# Patient Record
Sex: Male | Born: 1980 | Race: Black or African American | Hispanic: No | Marital: Married | Smoking: Current some day smoker
Health system: Southern US, Community
[De-identification: ages and names within clinical notes are randomized; demographics above are authoritative.]

## PROBLEM LIST (undated history)

## (undated) DIAGNOSIS — Z789 Other specified health status: Secondary | ICD-10-CM

## (undated) HISTORY — PX: NO PAST SURGERIES: SHX2092

---

## 2000-08-05 ENCOUNTER — Emergency Department (HOSPITAL_COMMUNITY): Admission: EM | Admit: 2000-08-05 | Discharge: 2000-08-05 | Payer: Self-pay | Admitting: *Deleted

## 2002-06-27 ENCOUNTER — Emergency Department (HOSPITAL_COMMUNITY): Admission: EM | Admit: 2002-06-27 | Discharge: 2002-06-27 | Payer: Self-pay | Admitting: Emergency Medicine

## 2002-06-27 ENCOUNTER — Encounter: Payer: Self-pay | Admitting: Emergency Medicine

## 2003-06-17 ENCOUNTER — Emergency Department (HOSPITAL_COMMUNITY): Admission: EM | Admit: 2003-06-17 | Discharge: 2003-06-17 | Payer: Self-pay | Admitting: Emergency Medicine

## 2006-08-22 ENCOUNTER — Emergency Department (HOSPITAL_COMMUNITY): Admission: EM | Admit: 2006-08-22 | Discharge: 2006-08-22 | Payer: Self-pay | Admitting: Emergency Medicine

## 2008-09-30 ENCOUNTER — Emergency Department (HOSPITAL_COMMUNITY): Admission: EM | Admit: 2008-09-30 | Discharge: 2008-09-30 | Payer: Self-pay | Admitting: Emergency Medicine

## 2009-05-19 ENCOUNTER — Emergency Department (HOSPITAL_COMMUNITY): Admission: EM | Admit: 2009-05-19 | Discharge: 2009-05-19 | Payer: Self-pay | Admitting: Emergency Medicine

## 2010-09-08 LAB — CBC
HCT: 38.5 % — ABNORMAL LOW (ref 39.0–52.0)
Hemoglobin: 13.3 g/dL (ref 13.0–17.0)
MCHC: 34.5 g/dL (ref 30.0–36.0)
MCV: 89.7 fL (ref 78.0–100.0)
Platelets: 214 10*3/uL (ref 150–400)
RBC: 4.3 MIL/uL (ref 4.22–5.81)
RDW: 13 % (ref 11.5–15.5)
WBC: 5.3 10*3/uL (ref 4.0–10.5)

## 2010-09-08 LAB — DIFFERENTIAL
Basophils Absolute: 0 10*3/uL (ref 0.0–0.1)
Basophils Relative: 1 % (ref 0–1)
Eosinophils Absolute: 0.1 10*3/uL (ref 0.0–0.7)
Eosinophils Relative: 2 % (ref 0–5)
Lymphocytes Relative: 30 % (ref 12–46)
Lymphs Abs: 1.6 10*3/uL (ref 0.7–4.0)
Monocytes Absolute: 0.3 10*3/uL (ref 0.1–1.0)
Monocytes Relative: 5 % (ref 3–12)
Neutro Abs: 3.3 10*3/uL (ref 1.7–7.7)
Neutrophils Relative %: 62 % (ref 43–77)

## 2010-09-08 LAB — POCT I-STAT, CHEM 8
BUN: 11 mg/dL (ref 6–23)
Calcium, Ion: 1.15 mmol/L (ref 1.12–1.32)
Chloride: 107 mEq/L (ref 96–112)
Creatinine, Ser: 1.1 mg/dL (ref 0.4–1.5)
Glucose, Bld: 101 mg/dL — ABNORMAL HIGH (ref 70–99)
HCT: 40 % (ref 39.0–52.0)
Hemoglobin: 13.6 g/dL (ref 13.0–17.0)
Potassium: 3.7 mEq/L (ref 3.5–5.1)
Sodium: 141 mEq/L (ref 135–145)
TCO2: 26 mmol/L (ref 0–100)

## 2011-03-09 ENCOUNTER — Inpatient Hospital Stay (INDEPENDENT_AMBULATORY_CARE_PROVIDER_SITE_OTHER)
Admission: RE | Admit: 2011-03-09 | Discharge: 2011-03-09 | Disposition: A | Discharge status: Home / Self Care | Payer: Self-pay | Source: Ambulatory Visit | Attending: Family Medicine | Admitting: Family Medicine

## 2011-03-09 DIAGNOSIS — R071 Chest pain on breathing: Secondary | ICD-10-CM

## 2011-09-14 ENCOUNTER — Emergency Department (HOSPITAL_COMMUNITY): Payer: BC Managed Care – PPO

## 2011-09-14 ENCOUNTER — Encounter (HOSPITAL_COMMUNITY): Payer: Self-pay | Admitting: Emergency Medicine

## 2011-09-14 ENCOUNTER — Emergency Department (HOSPITAL_COMMUNITY)
Admission: EM | Admit: 2011-09-14 | Discharge: 2011-09-14 | Disposition: A | Discharge status: Home / Self Care | Payer: BC Managed Care – PPO | Attending: Emergency Medicine | Admitting: Emergency Medicine

## 2011-09-14 DIAGNOSIS — R059 Cough, unspecified: Secondary | ICD-10-CM | POA: Insufficient documentation

## 2011-09-14 DIAGNOSIS — R51 Headache: Secondary | ICD-10-CM | POA: Insufficient documentation

## 2011-09-14 DIAGNOSIS — Z79899 Other long term (current) drug therapy: Secondary | ICD-10-CM | POA: Insufficient documentation

## 2011-09-14 DIAGNOSIS — J3489 Other specified disorders of nose and nasal sinuses: Secondary | ICD-10-CM | POA: Insufficient documentation

## 2011-09-14 DIAGNOSIS — J069 Acute upper respiratory infection, unspecified: Secondary | ICD-10-CM

## 2011-09-14 DIAGNOSIS — R5381 Other malaise: Secondary | ICD-10-CM | POA: Insufficient documentation

## 2011-09-14 DIAGNOSIS — R05 Cough: Secondary | ICD-10-CM | POA: Insufficient documentation

## 2011-09-14 DIAGNOSIS — R0789 Other chest pain: Secondary | ICD-10-CM | POA: Insufficient documentation

## 2011-09-14 MED ORDER — LORATADINE 10 MG PO TABS: 10.0000 mg | ORAL_TABLET | Freq: Every day | ORAL | Status: DC

## 2011-09-14 MED ORDER — IBUPROFEN 800 MG PO TABS
800.0000 mg | ORAL_TABLET | Freq: Once | ORAL | Status: AC
  Administered 2011-09-14: 800 mg via ORAL
  Filled 2011-09-14: qty 1

## 2011-09-14 MED ORDER — BENZONATATE 100 MG PO CAPS: 100.0000 mg | ORAL_CAPSULE | Freq: Three times a day (TID) | ORAL | Status: DC

## 2011-09-14 NOTE — ED Notes (Signed)
Rx x 2, pt voiced understanding to f/u with PCP and return with concerning sx

## 2011-09-14 NOTE — ED Provider Notes (Signed)
History     CSN: [id number]  Arrival date & time 09/14/11  1401   First MD Initiated Contact with Patient 09/14/11 1714      Chief Complaint  Patient presents with  . Headache  . Nasal Congestion    (Consider location/radiation/quality/duration/timing/severity/associated sxs/prior treatment) Patient is a 31 y.o. male presenting with URI. The history is provided by the patient. No language interpreter was used.  URI The primary symptoms include fatigue, headaches and cough. Primary symptoms do not include fever, ear pain, sore throat, wheezing, abdominal pain, nausea, vomiting, myalgias or rash. The current episode started 3 to 5 days ago. This is a new problem. The problem has been gradually worsening.  The fatigue began 3 to 5 days ago. The fatigue has been worsening since its onset.  The headache began more than 2 days ago. The headache developed gradually. Headache is a new problem. The headache is present continuously. The headache is not associated with aura, photophobia, eye pain, paresthesias or weakness.  The cough began 3 to 5 days ago. The cough is new. The cough is productive. The sputum is green.  Symptoms associated with the illness include congestion and rhinorrhea. The illness is not associated with chills, facial pain or sinus pressure. The following treatments were addressed: Acetaminophen was not tried. A decongestant was ineffective. Aspirin was not tried. NSAIDs were not tried.    History reviewed. No pertinent past medical history.  History reviewed. No pertinent past surgical history.  History reviewed. No pertinent family history.  History  Substance Use Topics  . Smoking status: Never Smoker   . Smokeless tobacco: Not on file  . Alcohol Use: No      Review of Systems  Constitutional: Positive for fatigue. Negative for fever, chills, activity change and appetite change.  HENT: Positive for congestion and rhinorrhea. Negative for ear pain, sore throat  and sinus pressure.   Eyes: Negative for photophobia and pain.  Respiratory: Positive for cough and chest tightness. Negative for shortness of breath and wheezing.   Cardiovascular: Negative for chest pain and palpitations.  Gastrointestinal: Negative for nausea, vomiting and abdominal pain.  Genitourinary: Negative for dysuria, urgency, frequency and flank pain.  Musculoskeletal: Negative for myalgias, back pain and gait problem.  Skin: Negative for rash and wound.  Neurological: Positive for headaches. Negative for dizziness, weakness, light-headedness, numbness and paresthesias.  All other systems reviewed and are negative.    Allergies  Review of patient's allergies indicates no known allergies.  Home Medications   Current Outpatient Rx  Name Route Sig Dispense Refill  . BENZONATATE 100 MG PO CAPS Oral Take 1 capsule (100 mg total) by mouth every 8 (eight) hours. 21 capsule 0  . LORATADINE 10 MG PO TABS Oral Take 1 tablet (10 mg total) by mouth daily. 30 tablet 0    BP 140/98  Pulse 65  Temp(Src) 98.4 F (36.9 C) (Oral)  Resp 16  SpO2 99%  Physical Exam  Nursing note and vitals reviewed. Constitutional: He is oriented to person, place, and time. He appears well-developed and well-nourished. No distress.  HENT:  Head: Normocephalic and atraumatic.  Mouth/Throat: Oropharynx is clear and moist. No oropharyngeal exudate.  Eyes: Conjunctivae and EOM are normal. Pupils are equal, round, and reactive to light.  Neck: Normal range of motion. Neck supple.  Cardiovascular: Normal rate, regular rhythm, normal heart sounds and intact distal pulses.  Exam reveals no gallop and no friction rub.   No murmur heard. Pulmonary/Chest: Effort normal  and breath sounds normal. No respiratory distress. He exhibits tenderness (L sided chest on palpation).  Abdominal: Soft. Bowel sounds are normal. There is no tenderness.  Musculoskeletal: Normal range of motion. He exhibits no edema and no  tenderness.  Lymphadenopathy:    He has no cervical adenopathy.  Neurological: He is alert and oriented to person, place, and time. No cranial nerve deficit.  Skin: Skin is warm and dry. No rash noted.    ED Course  Procedures (including critical care time)  Labs Reviewed - No data to display Dg Chest 2 View  09/14/2011  *RADIOLOGY REPORT*  Clinical Data: Chest pain for a few weeks.  Cough.  CHEST - 2 VIEW  Comparison: None.  Findings: Clear lungs.  Heart size within normal limits.  IMPRESSION: Clear lungs.  Original Report Authenticated By: Fuller Canada, M.D.     1. URI (upper respiratory infection)       MDM  URI. Chest x-ray unremarkable. Placed on Claritin and Tessalon for symptom control. Provided a work note.        Dayton Bailiff, MD 09/14/11 [phone]

## 2011-09-14 NOTE — Discharge Instructions (Signed)

## 2011-09-14 NOTE — ED Notes (Signed)
Pt reports headache, sinus congestion, chest tightness, and chills onset Saturday. Pt reports daughter with similar symptoms 1 week ago.

## 2011-09-16 ENCOUNTER — Encounter (HOSPITAL_COMMUNITY): Payer: Self-pay | Admitting: *Deleted

## 2011-09-16 ENCOUNTER — Emergency Department (HOSPITAL_COMMUNITY)
Admission: EM | Admit: 2011-09-16 | Discharge: 2011-09-16 | Disposition: A | Discharge status: Home / Self Care | Payer: BC Managed Care – PPO | Attending: Emergency Medicine | Admitting: Emergency Medicine

## 2011-09-16 DIAGNOSIS — J069 Acute upper respiratory infection, unspecified: Secondary | ICD-10-CM | POA: Insufficient documentation

## 2011-09-16 MED ORDER — FEXOFENADINE-PSEUDOEPHED ER 60-120 MG PO TB12: 1.0000 | ORAL_TABLET | Freq: Two times a day (BID) | ORAL | Status: DC

## 2011-09-16 MED ORDER — IBUPROFEN 200 MG PO TABS
400.0000 mg | ORAL_TABLET | Freq: Once | ORAL | Status: AC
  Administered 2011-09-16: 400 mg via ORAL
  Filled 2011-09-16: qty 2

## 2011-09-16 NOTE — ED Notes (Signed)
Pt states he thinks he is having flu symptoms. Pt states he is having n/v/d. Pt states is also having bodyaches and headaches. Pt denies any chest pain or sob

## 2011-09-16 NOTE — ED Provider Notes (Signed)
History    30ym with congestion. Gradual onset about 2d ago. Persistent since. Cough. Non productive. Mild diffuse HA. No numbness, tingling, loss of strength to acute visual changes. No neck pain or stiffness. No SOB. No CP. NO sick contacts. Nausea. Vomiting x1 yesterday. No urinary complaints. No rash. No significant pmhx. Non smoker.   CSN: [id number]  Arrival date & time 09/16/11  1550   First MD Initiated Contact with Patient 09/16/11 2047      Chief Complaint  Patient presents with  . Headache  . Generalized Body Aches    (Consider location/radiation/quality/duration/timing/severity/associated sxs/prior treatment) HPI  History reviewed. No pertinent past medical history.  History reviewed. No pertinent past surgical history.  No family history on file.  History  Substance Use Topics  . Smoking status: Never Smoker   . Smokeless tobacco: Not on file  . Alcohol Use: No      Review of Systems   Review of symptoms negative unless otherwise noted in HPI.   Allergies  Review of patient's allergies indicates no known allergies.  Home Medications   Current Outpatient Rx  Name Route Sig Dispense Refill  . ASPIRIN 325 MG PO TABS Oral Take 325 mg by mouth daily.    Marland Kitchen LORATADINE 10 MG PO TABS Oral Take 1 tablet (10 mg total) by mouth daily. 30 tablet 0    BP 135/82  Pulse 67  Temp(Src) 98.9 F (37.2 C) (Oral)  Resp 16  Wt 180 lb (81.647 kg)  SpO2 99%  Physical Exam  Nursing note and vitals reviewed. Constitutional: He appears well-developed and well-nourished. No distress.       Sitting up in bed. nad.  HENT:  Head: Normocephalic and atraumatic.  Left Ear: External ear normal.  Mouth/Throat: Oropharynx is clear and moist. No oropharyngeal exudate.  Eyes: Conjunctivae are normal. Right eye exhibits no discharge. Left eye exhibits no discharge.  Neck: Normal range of motion. Neck supple.  Cardiovascular: Normal rate, regular rhythm and normal heart  sounds.  Exam reveals no gallop and no friction rub.   No murmur heard. Pulmonary/Chest: Effort normal and breath sounds normal. No respiratory distress.  Abdominal: Soft. He exhibits no distension. There is no tenderness.  Musculoskeletal: He exhibits no edema and no tenderness.       Le symmetric as compared to each other. No calf tenderness. Neg homans.  Lymphadenopathy:    He has no cervical adenopathy.  Neurological: He is alert.  Skin: Skin is warm and dry. He is not diaphoretic.  Psychiatric: He has a normal mood and affect. His behavior is normal. Thought content normal.    ED Course  Procedures (including critical care time)  Labs Reviewed - No data to display No results found.   1. URI (upper respiratory infection)       MDM  30yM with upper respiratory symptoms, likely viral. Patient is afebrile and clinically well appearing. No respiratory distress on exam. Do not feel that imaging or laboratory testing is indicated at this time. Plan symptomatic treatment. Return precautions were discussed. Outpatient followup as needed.        Raeford Razor, MD 09/16/11 2111

## 2011-09-16 NOTE — Discharge Instructions (Signed)
Antibiotic Nonuse  Your caregiver felt that the infection or problem was not one that would be helped with an antibiotic. Infections may be caused by viruses or bacteria. Only a caregiver can tell which one of these is the likely cause of an illness. A cold is the most common cause of infection in both adults and children. A cold is a virus. Antibiotic treatment will have no effect on a viral infection. Viruses can lead to many lost days of work caring for sick children and many missed days of school. Children may catch as many as 10 "colds" or "flus" per year during which they can be tearful, cranky, and uncomfortable. The goal of treating a virus is aimed at keeping the ill person comfortable. Antibiotics are medications used to help the body fight bacterial infections. There are relatively few types of bacteria that cause infections but there are hundreds of viruses. While both viruses and bacteria cause infection they are very different types of germs. A viral infection will typically go away by itself within 7 to 10 days. Bacterial infections may spread or get worse without antibiotic treatment. Examples of bacterial infections are:  Sore throats (like strep throat or tonsillitis).   Infection in the lung (pneumonia).   Ear and skin infections.  Examples of viral infections are:  Colds or flus.   Most coughs and bronchitis.   Sore throats not caused by Strep.   Runny noses.  It is often best not to take an antibiotic when a viral infection is the cause of the problem. Antibiotics can kill off the helpful bacteria that we have inside our body and allow harmful bacteria to start growing. Antibiotics can cause side effects such as allergies, nausea, and diarrhea without helping to improve the symptoms of the viral infection. Additionally, repeated uses of antibiotics can cause bacteria inside of our body to become resistant. That resistance can be passed onto harmful bacterial. The next time  you have an infection it may be harder to treat if antibiotics are used when they are not needed. Not treating with antibiotics allows our own immune system to develop and take care of infections more efficiently. Also, antibiotics will work better for us when they are prescribed for bacterial infections. Treatments for a child that is ill may include:  Give extra fluids throughout the day to stay hydrated.   Get plenty of rest.   Only give your child over-the-counter or prescription medicines for pain, discomfort, or fever as directed by your caregiver.   The use of a cool mist humidifier may help stuffy noses.   Cold medications if suggested by your caregiver.  Your caregiver may decide to start you on an antibiotic if:  The problem you were seen for today continues for a longer length of time than expected.   You develop a secondary bacterial infection.  SEEK MEDICAL CARE IF:  Fever lasts longer than 5 days.   Symptoms continue to get worse after 5 to 7 days or become severe.   Difficulty in breathing develops.   Signs of dehydration develop (poor drinking, rare urinating, dark colored urine).   Changes in behavior or worsening tiredness (listlessness or lethargy).  Document Released: 08/02/2001 Document Revised: 05/13/2011 Document Reviewed: 01/29/2009 Exit[employer] Patient Information 2012 [employer].Upper Respiratory Infection, Adult An upper respiratory infection (URI) is also sometimes known as the common cold. The upper respiratory tract includes the nose, sinuses, throat, trachea, and bronchi. Bronchi are the airways leading to the lungs. Most   people improve within 1 week, but symptoms can last up to 2 weeks. A residual cough may last even longer.  CAUSES Many different viruses can infect the tissues lining the upper respiratory tract. The tissues become irritated and inflamed and often become very moist. Mucus production is also common. A cold is contagious. You can easily  spread the virus to others by oral contact. This includes kissing, sharing a glass, coughing, or sneezing. Touching your mouth or nose and then touching a surface, which is then touched by another person, can also spread the virus. SYMPTOMS  Symptoms typically develop 1 to 3 days after you come in contact with a cold virus. Symptoms vary from person to person. They may include:  Runny nose.   Sneezing.   Nasal congestion.   Sinus irritation.   Sore throat.   Loss of voice (laryngitis).   Cough.   Fatigue.   Muscle aches.   Loss of appetite.   Headache.   Low-grade fever.  DIAGNOSIS  You might diagnose your own cold based on familiar symptoms, since most people get a cold 2 to 3 times a year. Your caregiver can confirm this based on your exam. Most importantly, your caregiver can check that your symptoms are not due to another disease such as strep throat, sinusitis, pneumonia, asthma, or epiglottitis. Blood tests, throat tests, and X-rays are not necessary to diagnose a common cold, but they may sometimes be helpful in excluding other more serious diseases. Your caregiver will decide if any further tests are required. RISKS AND COMPLICATIONS  You may be at risk for a more severe case of the common cold if you smoke cigarettes, have chronic heart disease (such as heart failure) or lung disease (such as asthma), or if you have a weakened immune system. The very young and very old are also at risk for more serious infections. Bacterial sinusitis, middle ear infections, and bacterial pneumonia can complicate the common cold. The common cold can worsen asthma and chronic obstructive pulmonary disease (COPD). Sometimes, these complications can require emergency medical care and may be life-threatening. PREVENTION  The best way to protect against getting a cold is to practice good hygiene. Avoid oral or hand contact with people with cold symptoms. Wash your hands often if contact occurs.  There is no clear evidence that vitamin C, vitamin E, echinacea, or exercise reduces the chance of developing a cold. However, it is always recommended to get plenty of rest and practice good nutrition. TREATMENT  Treatment is directed at relieving symptoms. There is no cure. Antibiotics are not effective, because the infection is caused by a virus, not by bacteria. Treatment may include:  Increased fluid intake. Sports drinks offer valuable electrolytes, sugars, and fluids.   Breathing heated mist or steam (vaporizer or shower).   Eating chicken soup or other clear broths, and maintaining good nutrition.   Getting plenty of rest.   Using gargles or lozenges for comfort.   Controlling fevers with ibuprofen or acetaminophen as directed by your caregiver.   Increasing usage of your inhaler if you have asthma.  Zinc gel and zinc lozenges, taken in the first 24 hours of the common cold, can shorten the duration and lessen the severity of symptoms. Pain medicines may help with fever, muscle aches, and throat pain. A variety of non-prescription medicines are available to treat congestion and runny nose. Your caregiver can make recommendations and may suggest nasal or lung inhalers for other symptoms.  HOME CARE INSTRUCTIONS     Only take over-the-counter or prescription medicines for pain, discomfort, or fever as directed by your caregiver.   Use a warm mist humidifier or inhale steam from a shower to increase air moisture. This may keep secretions moist and make it easier to breathe.   Drink enough water and fluids to keep your urine clear or pale yellow.   Rest as needed.   Return to work when your temperature has returned to normal or as your caregiver advises. You may need to stay home longer to avoid infecting others. You can also use a face mask and careful hand washing to prevent spread of the virus.  SEEK MEDICAL CARE IF:   After the first few days, you feel you are getting worse  rather than better.   You need your caregiver's advice about medicines to control symptoms.   You develop chills, worsening shortness of breath, or brown or red sputum. These may be signs of pneumonia.   You develop yellow or brown nasal discharge or pain in the face, especially when you bend forward. These may be signs of sinusitis.   You develop a fever, swollen neck glands, pain with swallowing, or white areas in the back of your throat. These may be signs of strep throat.  SEEK IMMEDIATE MEDICAL CARE IF:   You have a fever.   You develop severe or persistent headache, ear pain, sinus pain, or chest pain.   You develop wheezing, a prolonged cough, cough up blood, or have a change in your usual mucus (if you have chronic lung disease).   You develop sore muscles or a stiff neck.  Document Released: 11/17/2000 Document Revised: 05/13/2011 Document Reviewed: 09/25/2010 [facility] Patient Information 2012 [place], [place].  RESOURCE GUIDE  Dental Problems  Patients with Medicaid: [facility] [phone] [address]                                           [phone] [address] Phone:  [phone]                                                  Phone:  [phone]  If unable to pay or uninsured, contact:  [facility] or [facility]. to become qualified for the adult dental clinic.  Chronic Pain Problems Contact [facility]  [phone] Patients need to be referred by their primary care doctor.  Insufficient Money for Medicine Contact [employer] Way:  call "211" or [facility] 850-750-4157.  No Primary Care Doctor Call [facility]  [phone] Other agencies that provide inexpensive medical care    [facility]  [phone]    [facility]  [phone]    [facility]  [phone]    [facility]  [phone]    [facility]  [phone]    [facility]   [phone]  Psychological Services [facility]  [phone] [facility] Services  [phone] [facility] Mental Health   [phone] (emergency services [phone])  Substance Abuse Resources [facility]  [phone] [facility] [phone]  [facility] 217-016-9840 [facility] ([phone] Residential & Outpatient Substance Abuse Program  361-615-1143  Abuse/Neglect [facility] Child Abuse Hotline 3125651952 [facility] Child Abuse Hotline 5041732699 (After Hours)  Emergency Shelter [facility] [phone]  Maternity Homes [facility] [phone] [facility] Services [phone]  MRSA Hotline #:   (276)825-4211    [facility] Resources  [facility]     United [employer]                          [facility] Dept. [address] [place]                       [address]      [address]  [place]ondell Reveal Phone:  [phone]                                   Phone:  [phone]                 Phone:  [phone]  [facility] Mental Health Phone:  [phone]  [facility] Child Abuse Hotline (765)611-8782 [phone] (After Hours)

## 2011-09-16 NOTE — ED Notes (Signed)
Pt presents with c/c of n/v, generalized body aches, chills, and sinus pressure.  Pt also complains of some chest pain related to his cough.  St's he's been coughing up greeish/yellowish phlegm.

## 2012-01-30 ENCOUNTER — Emergency Department (HOSPITAL_COMMUNITY)
Admission: EM | Admit: 2012-01-30 | Discharge: 2012-01-30 | Disposition: A | Discharge status: Home / Self Care | Payer: BC Managed Care – PPO | Attending: Emergency Medicine | Admitting: Emergency Medicine

## 2012-01-30 ENCOUNTER — Encounter (HOSPITAL_COMMUNITY): Payer: Self-pay | Admitting: Emergency Medicine

## 2012-01-30 DIAGNOSIS — L02419 Cutaneous abscess of limb, unspecified: Secondary | ICD-10-CM | POA: Insufficient documentation

## 2012-01-30 DIAGNOSIS — IMO0002 Reserved for concepts with insufficient information to code with codable children: Secondary | ICD-10-CM | POA: Insufficient documentation

## 2012-01-30 DIAGNOSIS — L0291 Cutaneous abscess, unspecified: Secondary | ICD-10-CM

## 2012-01-30 MED ORDER — CLINDAMYCIN HCL 300 MG PO CAPS: 300.0000 mg | ORAL_CAPSULE | Freq: Four times a day (QID) | ORAL | Status: AC

## 2012-01-30 NOTE — ED Notes (Signed)
Pt with multiple small abscess noted to arms and legs x 2 weeks with some purulent drainage

## 2012-01-30 NOTE — ED Provider Notes (Signed)
History     CSN: [id number]  Arrival date & time 01/30/12  0802   First MD Initiated Contact with Patient 01/30/12 0940      Chief Complaint  Patient presents with  . Abscess    (Consider location/radiation/quality/duration/timing/severity/associated sxs/prior treatment) HPI Comments: 31 y/o male presents with multiple abscesses worsening over the past 2 weeks. Went on vacation to the beach 2 weeks right before abscess developed. Began as a little pimple like mass, and two of them got bigger. One on his right arm began draining pus. The large one on his left thigh has not drained anything. Denies any fever, chills, joint pain, or other rashes. No contacts have similar symptoms. Denies being bit by anything.  Patient is a 31 y.o. male presenting with abscess. The history is provided by the patient.  Abscess  Pertinent negatives include no fever.    History reviewed. No pertinent past medical history.  History reviewed. No pertinent past surgical history.  History reviewed. No pertinent family history.  History  Substance Use Topics  . Smoking status: Never Smoker   . Smokeless tobacco: Not on file  . Alcohol Use: No      Review of Systems  Constitutional: Negative for fever and chills.  Musculoskeletal: Negative for arthralgias.  Skin:       Positive for abscess    Allergies  Review of patient's allergies indicates no known allergies.  Home Medications   Current Outpatient Rx  Name Route Sig Dispense Refill  . ASPIRIN 325 MG PO TABS Oral Take 325 mg by mouth every 6 (six) hours as needed. For pain.      BP 126/80  Pulse 90  Temp 98.1 F (36.7 C) (Oral)  Resp 20  SpO2 100%  Physical Exam  Nursing note and vitals reviewed. Constitutional: He is oriented to person, place, and time. He appears well-developed and well-nourished. No distress.  HENT:  Head: Normocephalic and atraumatic.  Eyes: Conjunctivae and EOM are normal.  Neck: Neck supple.    Cardiovascular: Normal rate, regular rhythm and normal heart sounds.   Pulmonary/Chest: Effort normal and breath sounds normal.  Lymphadenopathy:    He has no axillary adenopathy.       Left: No inguinal adenopathy present.  Neurological: He is alert and oriented to person, place, and time.  Skin: Skin is warm and dry.       2cm diameter abscess with purulent drainage present on right upper arm with surrounding erythema. 2 cm diameter abscess without drainage present on left anterior thigh with surrounding erythema. Both tender to palpation and both flucctuant.  Psychiatric: He has a normal mood and affect. His behavior is normal.    ED Course  Procedures (including critical care time) INCISION AND DRAINAGE Performed by: Johnnette Gourd Consent: Verbal consent obtained. Risks and benefits: risks, benefits and alternatives were discussed Type: abscess  Body area: right upper arm, left thigh  Anesthesia: local infiltration  Local anesthetic: lidocaine 2% without epinephrine  Anesthetic total: 5 ml in right arm, 3 ml in left leg  Complexity: complex Blunt dissection to break up loculations  Drainage: purulent  Drainage amount: large  Packing material: 1/4 in iodoform gauze  Patient tolerance: Patient tolerated the procedure well with no immediate complications.    Labs Reviewed - No data to display No results found.   1. Abscess and cellulitis       MDM  31 y/o male with 2 abscesses with surrounding erythema. Purulent drainage expressed. Culture obtained.  Rx clinda. Advised patient to return to ED, urgent care, or one of the locations on resource list in 2-3 days for packing removal.        Trevor Mace, PA-C 01/30/12 1105

## 2012-02-03 NOTE — ED Provider Notes (Signed)
Medical screening examination/treatment/procedure(s) were performed by non-physician practitioner and as supervising physician I was immediately available for consultation/collaboration.  Hurman Horn, MD 02/03/12 1135

## 2012-02-05 ENCOUNTER — Encounter (HOSPITAL_COMMUNITY): Payer: Self-pay | Admitting: *Deleted

## 2012-02-05 ENCOUNTER — Emergency Department (HOSPITAL_COMMUNITY)
Admission: EM | Admit: 2012-02-05 | Discharge: 2012-02-05 | Disposition: A | Discharge status: Home / Self Care | Payer: BC Managed Care – PPO | Attending: Emergency Medicine | Admitting: Emergency Medicine

## 2012-02-05 DIAGNOSIS — Z5189 Encounter for other specified aftercare: Secondary | ICD-10-CM

## 2012-02-05 DIAGNOSIS — Z4801 Encounter for change or removal of surgical wound dressing: Secondary | ICD-10-CM | POA: Insufficient documentation

## 2012-02-05 NOTE — ED Notes (Signed)
Pt is here to have packing removed from right forearm and left thigh that was placed on SUnday or monday

## 2012-02-05 NOTE — ED Provider Notes (Signed)
History  This chart was scribed for Derwood Kaplan, MD by Erskine Emery. This patient was seen in room  and the patient's care was started at 11:26.   CSN: [id number]  Arrival date & time 02/05/12  1107   First MD Initiated Contact with Patient 02/05/12 1126      Chief Complaint  Patient presents with  . Abscess    (Consider location/radiation/quality/duration/timing/severity/associated sxs/prior treatment) The history is provided by the patient. No language interpreter was used.   Dominic Lowery is a 31 y.o. male who presents to the Emergency Department complaining of 3 healing abscesses that were treated here with incision and drainage (that drained puss) last Sunday (6 days ago). Pt was told to come back a week later to have the packing removed. Pt denies any nausea, vomiting, fever, chills, or h/o similar boils and claims he is otherwise healthy. Pt reports he just started his antibiotic course today.  History reviewed. No pertinent past medical history.  History reviewed. No pertinent past surgical history.  No family history on file.  History  Substance Use Topics  . Smoking status: Never Smoker   . Smokeless tobacco: Not on file  . Alcohol Use: No      Review of Systems  Constitutional: Negative for fever and chills.  Respiratory: Negative for shortness of breath.   Gastrointestinal: Negative for nausea and vomiting.  Genitourinary: Negative for difficulty urinating.  Skin:       3 packed and healing abscesses.   Neurological: Negative for weakness.    Allergies  Review of patient's allergies indicates no known allergies.  Home Medications   Current Outpatient Rx  Name Route Sig Dispense Refill  . CLINDAMYCIN HCL 300 MG PO CAPS Oral Take 1 capsule (300 mg total) by mouth 4 (four) times daily. X 7 days 28 capsule 0  . VISINE OP Both Eyes Place 1 drop into both eyes as needed. For dry eyes      BP 118/69  Pulse 77  Temp 98.2 F (36.8 C) (Oral)  Resp  18  SpO2 100%  Physical Exam  Nursing note and vitals reviewed. Constitutional: He is oriented to person, place, and time. He appears well-developed and well-nourished. No distress.  HENT:  Head: Normocephalic and atraumatic.  Eyes: EOM are normal.  Neck: Neck supple. No tracheal deviation present.  Cardiovascular: Normal rate, regular rhythm and normal heart sounds.   Pulmonary/Chest: Effort normal and breath sounds normal. No respiratory distress. He has no wheezes.  Abdominal: Soft. There is no tenderness.  Musculoskeletal: Normal range of motion.  Neurological: He is alert and oriented to person, place, and time.  Skin: Skin is warm and dry.       Right forearm: 4x4cm lesion with surrounding induration that is nontender with no fluctuants. Wound is clean and dry, showing no drainage.  Left forearm: 3 cm area of induration with a lesion in middle, 1 cm in diameter that is clean and dry. Left thigh: 5x3 cm lesion that is clean and dry.  Psychiatric: He has a normal mood and affect. His behavior is normal.    ED Course  Procedures (including critical care time) DIAGNOSTIC STUDIES: Oxygen Saturation is 100% on room air, normal by my interpretation.    COORDINATION OF CARE: 12:10--I evaluated the patient and we discussed a treatment plan including OTC bactracin to which the pt agreed. I instructed the pt to keep the area surrounding all of his abscesses clean and dry, to finish his antibiotic  course as prescribed, and to find a PCP to follow up with.    Labs Reviewed - No data to display No results found.   No diagnosis found.    MDM  Pt is a healthy male s/p i&d x 2 areas - comes for wound recheck. The wound appear clean and dry. Pt is taking AB currently. Will d/c.     Medical screening examination/treatment/procedure(s) were conducted by me as a physician. Scribe utilized for documentation purposes only.   Derwood Kaplan, MD 02/05/12 1242

## 2012-05-11 ENCOUNTER — Emergency Department (HOSPITAL_COMMUNITY)
Admission: EM | Admit: 2012-05-11 | Discharge: 2012-05-11 | Disposition: A | Discharge status: Home / Self Care | Payer: BC Managed Care – PPO | Attending: Emergency Medicine | Admitting: Emergency Medicine

## 2012-05-11 DIAGNOSIS — R5381 Other malaise: Secondary | ICD-10-CM | POA: Insufficient documentation

## 2012-05-11 DIAGNOSIS — R51 Headache: Secondary | ICD-10-CM | POA: Insufficient documentation

## 2012-05-11 DIAGNOSIS — K2970 Gastritis, unspecified, without bleeding: Secondary | ICD-10-CM

## 2012-05-11 DIAGNOSIS — A088 Other specified intestinal infections: Secondary | ICD-10-CM | POA: Insufficient documentation

## 2012-05-11 DIAGNOSIS — J029 Acute pharyngitis, unspecified: Secondary | ICD-10-CM | POA: Insufficient documentation

## 2012-05-11 LAB — POCT I-STAT, CHEM 8
BUN: 17 mg/dL (ref 6–23)
Chloride: 106 mEq/L (ref 96–112)
Creatinine, Ser: 1.2 mg/dL (ref 0.50–1.35)
Potassium: 3.6 mEq/L (ref 3.5–5.1)
Sodium: 140 mEq/L (ref 135–145)

## 2012-05-11 LAB — URINALYSIS, ROUTINE W REFLEX MICROSCOPIC
Glucose, UA: NEGATIVE mg/dL
Hgb urine dipstick: NEGATIVE
Leukocytes, UA: NEGATIVE
Protein, ur: NEGATIVE mg/dL
Specific Gravity, Urine: 1.025 (ref 1.005–1.030)
pH: 6.5 (ref 5.0–8.0)

## 2012-05-11 MED ORDER — METOCLOPRAMIDE HCL 5 MG/ML IJ SOLN
10.0000 mg | Freq: Once | INTRAMUSCULAR | Status: AC
  Administered 2012-05-11: 10 mg via INTRAMUSCULAR
  Filled 2012-05-11: qty 2

## 2012-05-11 MED ORDER — DIPHENHYDRAMINE HCL 50 MG/ML IJ SOLN
25.0000 mg | Freq: Once | INTRAMUSCULAR | Status: AC
  Administered 2012-05-11: 25 mg via INTRAVENOUS
  Filled 2012-05-11: qty 1

## 2012-05-11 MED ORDER — SODIUM CHLORIDE 0.9 % IV SOLN
INTRAVENOUS | Status: DC
  Administered 2012-05-11 (×2): via INTRAVENOUS

## 2012-05-11 MED ORDER — METOCLOPRAMIDE HCL 5 MG/ML IJ SOLN: 10.0000 mg | Freq: Once | INTRAMUSCULAR | Status: DC

## 2012-05-11 MED ORDER — ONDANSETRON 4 MG PO TBDP: 4.0000 mg | ORAL_TABLET | Freq: Four times a day (QID) | ORAL | Status: DC | PRN

## 2012-05-11 MED ORDER — ONDANSETRON HCL 4 MG/2ML IJ SOLN
4.0000 mg | Freq: Once | INTRAMUSCULAR | Status: DC
  Filled 2012-05-11: qty 2

## 2012-05-11 MED ORDER — LORAZEPAM 2 MG/ML IJ SOLN
1.0000 mg | Freq: Once | INTRAMUSCULAR | Status: AC
  Administered 2012-05-11: 1 mg via INTRAVENOUS
  Filled 2012-05-11: qty 1

## 2012-05-11 MED ORDER — DIPHENHYDRAMINE HCL 50 MG/ML IJ SOLN: 25.0000 mg | Freq: Once | INTRAMUSCULAR | Status: DC

## 2012-05-11 MED ORDER — SODIUM CHLORIDE 0.9 % IV BOLUS (SEPSIS)
1000.0000 mL | Freq: Once | INTRAVENOUS | Status: AC
  Administered 2012-05-11: 1000 mL via INTRAVENOUS

## 2012-05-11 MED ORDER — KETOROLAC TROMETHAMINE 30 MG/ML IJ SOLN
30.0000 mg | Freq: Once | INTRAMUSCULAR | Status: AC
  Administered 2012-05-11: 30 mg via INTRAVENOUS
  Filled 2012-05-11: qty 1

## 2012-05-11 NOTE — ED Notes (Signed)
Per report yesterday afternoon pt states that he began to have nausea.  Since then he has vomited x 3 (last episode of vomiting was 0650 this am).  No distress noted.  Denies dizziness or weakness.  Just general malaise.

## 2012-05-11 NOTE — ED Provider Notes (Signed)
Medical screening examination/treatment/procedure(s) were performed by non-physician practitioner and as supervising physician I was immediately available for consultation/collaboration.   Asuncion Tapscott III, MD 05/11/12 2004 

## 2012-05-11 NOTE — ED Provider Notes (Signed)
History     CSN: [id number]  Arrival date & time 05/11/12  7829   First MD Initiated Contact with Patient 05/11/12 [phone]      Chief Complaint  Patient presents with  . Emesis    (Consider location/radiation/quality/duration/timing/severity/associated sxs/prior treatment) HPI Comments: 31 year old male with no significant past medical history presents emergency department complaining of emesis and migraine.  Onset of symptoms began the yesterday afternoon with nausea and then patient gradually developed emesis with last episode being about an hour prior to arrival (7 a.m.).  Associated symptoms include fatigue, myalgias, and sore throat.  Patient denies any abdominal pain, change in bowel movements, change in vision slurred speech, chest pain, shortness of breath,denies nuchal rigidity, pain with EOMs, syncope, palpitations fevers night sweats or chills..  In addition patient reports a headache that began this morning and a feeling of a "dry mouth" & HA.   The history is provided by the patient.    No past medical history on file.  No past surgical history on file.  No family history on file.  History  Substance Use Topics  . Smoking status: Never Smoker   . Smokeless tobacco: Not on file  . Alcohol Use: No      Review of Systems  Constitutional: Negative for fever, diaphoresis and activity change.  HENT: Positive for sore throat. Negative for congestion, drooling, mouth sores, trouble swallowing, neck pain, neck stiffness and voice change.   Respiratory: Negative for cough, chest tightness and wheezing.   Cardiovascular: Negative for chest pain and leg swelling.  Gastrointestinal: Positive for nausea and vomiting.  Genitourinary: Negative for dysuria.  Musculoskeletal: Negative for myalgias and back pain.  Skin: Negative for color change and wound.  Neurological: Positive for headaches. Negative for dizziness, speech difficulty, weakness, light-headedness and numbness.   All other systems reviewed and are negative.    Allergies  Review of patient's allergies indicates no known allergies.  Home Medications   Current Outpatient Rx  Name  Route  Sig  Dispense  Refill  . VISINE OP   Both Eyes   Place 1 drop into both eyes as needed. For dry eyes           BP 133/88  Pulse 67  Temp 98.1 F (36.7 C) (Oral)  Resp 18  SpO2 100%  Physical Exam  Nursing note and vitals reviewed. Constitutional: He is oriented to person, place, and time. He appears well-developed and well-nourished. No distress.  HENT:  Head: Normocephalic and atraumatic. No trismus in the jaw.  Right Ear: Tympanic membrane, external ear and ear canal normal.  Left Ear: Tympanic membrane, external ear and ear canal normal.  Nose: Nose normal. No rhinorrhea. Right sinus exhibits no maxillary sinus tenderness and no frontal sinus tenderness. Left sinus exhibits no maxillary sinus tenderness and no frontal sinus tenderness.  Mouth/Throat: Uvula is midline and mucous membranes are normal. Normal dentition. No dental abscesses or uvula swelling. Posterior oropharyngeal edema present. No oropharyngeal exudate, posterior oropharyngeal erythema or tonsillar abscesses.       No submental edema, tongue not elevated, no trismus. No impending airway obstruction; Pt able to speak full sentences, swallow intact, no drooling, stridor, or tonsillar/uvula displacement. No palatal petechia  Eyes: Conjunctivae normal and EOM are normal. Pupils are equal, round, and reactive to light. No scleral icterus.  Neck: Trachea normal, normal range of motion and full passive range of motion without pain. Neck supple. No rigidity. Normal range of motion present. No Brudzinski's sign  noted.       Flexion and extension of neck without pain or difficulty. Able to breath without difficulty in extension.  Cardiovascular: Normal rate, regular rhythm, normal heart sounds and intact distal pulses.   Pulmonary/Chest: Effort  normal and breath sounds normal. No stridor. No respiratory distress. He has no wheezes.  Abdominal: Soft.       Soft nontender  Musculoskeletal: Normal range of motion.  Lymphadenopathy:       Head (right side): No preauricular and no posterior auricular adenopathy present.       Head (left side): No preauricular and no posterior auricular adenopathy present.    He has cervical adenopathy.  Neurological: He is alert and oriented to person, place, and time. He has normal strength.       CN III-XII intact, good coordination, normal gait, strength 5/5 bilaterally, intact distal sensation.  Skin: Skin is warm and dry. No rash noted. He is not diaphoretic.       No rash, non diaphoretic  Psychiatric: He has a normal mood and affect.    ED Course  Procedures (including critical care time)  Labs Reviewed  POCT I-STAT, CHEM 8 - Abnormal; Notable for the following:    Glucose, Bld 103 (*)     All other components within normal limits  URINALYSIS, ROUTINE W REFLEX MICROSCOPIC   No results found.   No diagnosis found.  BP 133/88  Pulse 67  Temp 98.1 F (36.7 C) (Oral)  Resp 18  SpO2 100%   MDM  Emesis  Patient with symptoms consistent with viral gastritis.  Vitals are stable, no fever.  No signs of dehydration, tolerating PO fluids > 6 oz.  Lungs are clear.  No focal abdominal pain, no concern for appendicitis, cholecystitis, pancreatitis, ruptured viscus, UTI, kidney stone, or any other abdominal etiology.  Supportive therapy indicated with return if symptoms worsen.  Patient counseled.         Jaci Carrel, [place] 05/11/12 1037

## 2013-02-28 ENCOUNTER — Emergency Department (HOSPITAL_COMMUNITY)
Admission: EM | Admit: 2013-02-28 | Discharge: 2013-02-28 | Disposition: A | Discharge status: Home / Self Care | Payer: BC Managed Care – PPO | Attending: Emergency Medicine | Admitting: Emergency Medicine

## 2013-02-28 ENCOUNTER — Encounter (HOSPITAL_COMMUNITY): Payer: Self-pay | Admitting: *Deleted

## 2013-02-28 DIAGNOSIS — J069 Acute upper respiratory infection, unspecified: Secondary | ICD-10-CM | POA: Insufficient documentation

## 2013-02-28 DIAGNOSIS — R05 Cough: Secondary | ICD-10-CM | POA: Insufficient documentation

## 2013-02-28 DIAGNOSIS — R079 Chest pain, unspecified: Secondary | ICD-10-CM | POA: Insufficient documentation

## 2013-02-28 DIAGNOSIS — R059 Cough, unspecified: Secondary | ICD-10-CM | POA: Insufficient documentation

## 2013-02-28 DIAGNOSIS — R5381 Other malaise: Secondary | ICD-10-CM | POA: Insufficient documentation

## 2013-02-28 DIAGNOSIS — J3489 Other specified disorders of nose and nasal sinuses: Secondary | ICD-10-CM | POA: Insufficient documentation

## 2013-02-28 NOTE — ED Notes (Signed)
Patient with reported cough and fever for 2 days.  He has tried sinus congestion medication with out relief.  Patient with no s/sx of distress.

## 2013-02-28 NOTE — ED Notes (Signed)
Patient and family verbalized understanding of discharge instructions.  Patient encouraged to follow up with MD and return as needed

## 2013-02-28 NOTE — ED Provider Notes (Signed)
CSN: [id number]     Arrival date & time 02/28/13  0740 History   First MD Initiated Contact with Patient 02/28/13 [phone]     Chief Complaint  Patient presents with  . Fever  . Cough   (Consider location/radiation/quality/duration/timing/severity/associated sxs/prior Treatment) HPI Pt is a 32yo male c/o cough, congestion, and fever up to 100F for the last 2 days.  Pt also c/o upper chest pain that is aching, 6/10 in nature, "feels like someone is beating me with a little hammer in the chest."  Nothing makes pain better, coughing makes pain worse. Pain does not radiates. Has been using equate sinus congestion medications without relief.  Denies n/v/d. Denies hx of asthma or cardiac problems. Pt is in with his 3yo son with similar symptoms.  History reviewed. No pertinent past medical history. History reviewed. No pertinent past surgical history. No family history on file. History  Substance Use Topics  . Smoking status: Never Smoker   . Smokeless tobacco: Not on file  . Alcohol Use: No    Review of Systems  Constitutional: Positive for fever and fatigue. Negative for chills and appetite change.  HENT: Positive for congestion and rhinorrhea. Negative for ear pain, sore throat, neck pain and neck stiffness.   Respiratory: Positive for cough.   Cardiovascular: Positive for chest pain. Negative for palpitations and leg swelling.  Gastrointestinal: Negative for nausea, vomiting, abdominal pain and diarrhea.  All other systems reviewed and are negative.    Allergies  Review of patient's allergies indicates no known allergies.  Home Medications   Current Outpatient Rx  Name  Route  Sig  Dispense  Refill  . OVER THE COUNTER MEDICATION   Oral   Take 2 tablets by mouth daily as needed (cold , sinus congestion).          BP 145/93  Pulse 84  Temp(Src) 98.2 F (36.8 C) (Oral)  Resp 14  Wt 185 lb (83.915 kg)  SpO2 99% Physical Exam  Nursing note and vitals  reviewed. Constitutional: He appears well-developed and well-nourished.  Pt lying comfortably in exam bed, NAD.    HENT:  Head: Normocephalic and atraumatic.  Right Ear: Hearing, tympanic membrane, external ear and ear canal normal.  Left Ear: Hearing, tympanic membrane, external ear and ear canal normal.  Nose: Mucosal edema and rhinorrhea present.  Mouth/Throat: Uvula is midline, oropharynx is clear and moist and mucous membranes are normal. No oropharyngeal exudate, posterior oropharyngeal edema, posterior oropharyngeal erythema or tonsillar abscesses.  Eyes: Conjunctivae are normal. No scleral icterus.  Neck: Normal range of motion. Neck supple.  Cardiovascular: Normal rate, regular rhythm and normal heart sounds.   Pulmonary/Chest: Effort normal and breath sounds normal. No respiratory distress. He has no wheezes. He has no rales. He exhibits no tenderness.  No respiratory distress, able to speak in full sentences w/o difficulty. Lungs: CTAB   Abdominal: Soft. Bowel sounds are normal. He exhibits no distension and no mass. There is no tenderness. There is no rebound and no guarding.  Musculoskeletal: Normal range of motion.  Neurological: He is alert.  Skin: Skin is warm and dry.    ED Course  Procedures (including critical care time) Labs Review Labs Reviewed - No data to display Imaging Review No results found.  MDM   1. URI, acute    Chest pain, atypical for ACS. Pt is PERC negative.  C/o URI type symptoms with hx of sick contacts, including his 3yo son here for same. Pt appears well,  non-toxic. No respiratory distress. EKG: unremarkable. Do not believe further workup is needed at this time.  Advised to continue taking OTC ibuprofen and acetaminophen for fever.  Encouraged rest and fluids. Work note provided. Return precautions provided. All questions answered, and concerns addressed. Pt verbalized understanding and agreement with tx plan      Junius Finner,  PA-C 02/28/13 1507

## 2013-03-01 NOTE — ED Provider Notes (Signed)
Medical screening examination/treatment/procedure(s) were performed by non-physician practitioner and as supervising physician I was immediately available for consultation/collaboration.   Sanay Belmar, MD 03/01/13 1251 

## 2014-10-15 ENCOUNTER — Emergency Department (HOSPITAL_COMMUNITY)
Admission: EM | Admit: 2014-10-15 | Discharge: 2014-10-15 | Disposition: A | Discharge status: Home / Self Care | Payer: BC Managed Care – PPO | Attending: Emergency Medicine | Admitting: Emergency Medicine

## 2014-10-15 ENCOUNTER — Encounter (HOSPITAL_COMMUNITY): Payer: Self-pay | Admitting: *Deleted

## 2014-10-15 ENCOUNTER — Emergency Department (HOSPITAL_COMMUNITY): Payer: BC Managed Care – PPO

## 2014-10-15 DIAGNOSIS — R05 Cough: Secondary | ICD-10-CM | POA: Diagnosis present

## 2014-10-15 DIAGNOSIS — R059 Cough, unspecified: Secondary | ICD-10-CM

## 2014-10-15 DIAGNOSIS — J069 Acute upper respiratory infection, unspecified: Secondary | ICD-10-CM | POA: Diagnosis not present

## 2014-10-15 DIAGNOSIS — Z720 Tobacco use: Secondary | ICD-10-CM | POA: Diagnosis not present

## 2014-10-15 MED ORDER — BENZONATATE 100 MG PO CAPS
100.0000 mg | ORAL_CAPSULE | Freq: Once | ORAL | Status: AC
  Administered 2014-10-15: 100 mg via ORAL
  Filled 2014-10-15: qty 1

## 2014-10-15 MED ORDER — IPRATROPIUM BROMIDE 0.02 % IN SOLN
0.5000 mg | Freq: Once | RESPIRATORY_TRACT | Status: AC
  Administered 2014-10-15: 0.5 mg via RESPIRATORY_TRACT
  Filled 2014-10-15: qty 2.5

## 2014-10-15 MED ORDER — ALBUTEROL SULFATE (2.5 MG/3ML) 0.083% IN NEBU
5.0000 mg | INHALATION_SOLUTION | Freq: Once | RESPIRATORY_TRACT | Status: AC
  Administered 2014-10-15: 5 mg via RESPIRATORY_TRACT
  Filled 2014-10-15: qty 6

## 2014-10-15 MED ORDER — ALBUTEROL SULFATE HFA 108 (90 BASE) MCG/ACT IN AERS
2.0000 | INHALATION_SPRAY | RESPIRATORY_TRACT | Status: DC | PRN
  Administered 2014-10-15: 2 via RESPIRATORY_TRACT
  Filled 2014-10-15: qty 6.7

## 2014-10-15 MED ORDER — BENZONATATE 100 MG PO CAPS: 100.0000 mg | ORAL_CAPSULE | Freq: Three times a day (TID) | ORAL | Status: DC

## 2014-10-15 NOTE — ED Provider Notes (Signed)
CSN: [id number]     Arrival date & time 10/15/14  0548 History   First MD Initiated Contact with Patient 10/15/14 [phone]     Chief Complaint  Patient presents with  . URI     (Consider location/radiation/quality/duration/timing/severity/associated sxs/prior Treatment) HPI    PCP: Default, Provider, MD Blood pressure 145/75, pulse 82, temperature 98.4 F (36.9 C), temperature source Oral, resp. rate 18, SpO2 96 %.  Loetta RoughMario D Dresden is a 34 y.o.male without any significant PMH presents to the ER with complaints of cough- non productive, nasal congestion, sinus pressure, sneezing since this Sunday Morning. He has been taking Claritin for his symptoms but it has not helped. He feels that his breathing has been somewhat strained. Coughing worse at night.    Negative Review of Symptoms: Denies abdominal pains, nausea, vomiting, diarrhea, fevers, neck pains, severe headache, back pain, dysuria, ear/throat pain, change in vision, weakness, confusion, lower extremity swelling.   History reviewed. No pertinent past medical history. History reviewed. No pertinent past surgical history. No family history on file. History  Substance Use Topics  . Smoking status: Current Some Day Smoker    Types: Cigars  . Smokeless tobacco: Not on file  . Alcohol Use: No    Review of Systems 10 Systems reviewed and are negative for acute change except as noted in the HPI.    Allergies  Review of patient's allergies indicates no known allergies.  Home Medications   Prior to Admission medications   Medication Sig Start Date End Date Taking? Authorizing Provider  loratadine (CLARITIN) 10 MG tablet Take 10 mg by mouth daily as needed for allergies.   Yes Historical Provider, MD  OVER THE COUNTER MEDICATION Take 2 tablets by mouth daily as needed (cold , sinus congestion).   Yes Historical Provider, MD   BP 145/75 mmHg  Pulse 82  Temp(Src) 98.4 F (36.9 C) (Oral)  Resp 18  SpO2 99% Physical Exam   Constitutional: He appears well-developed and well-nourished. No distress.  HENT:  Head: Normocephalic and atraumatic.  Right Ear: Tympanic membrane and ear canal normal.  Left Ear: Tympanic membrane and ear canal normal.  Nose: Nose normal.  Mouth/Throat: Uvula is midline, oropharynx is clear and moist and mucous membranes are normal.  Eyes: Conjunctivae, EOM and lids are normal. Pupils are equal, round, and reactive to light.  Neck: Normal range of motion. Neck supple.  Cardiovascular: Normal rate and regular rhythm.   Pulmonary/Chest: Effort normal. He has decreased breath sounds. He has no wheezes. He has no rhonchi. He has no rales.  Abdominal: Soft. Bowel sounds are normal. He exhibits no distension.  Neurological: He is alert.  Skin: Skin is warm and dry. No rash noted.  Nursing note and vitals reviewed.   ED Course  Procedures (including critical care time) Labs Review Labs Reviewed - No data to display  Imaging Review Dg Chest 2 View  10/15/2014   CLINICAL DATA:  34 year old male with cough congestion shortness of breath and left side chest pain for 2 days. Initial encounter.  EXAM: CHEST  2 VIEW  COMPARISON:  09/14/2011.  FINDINGS: Lung volumes are stable and within normal limits. Normal cardiac size and mediastinal contours. Visualized tracheal air column is within normal limits. No pneumothorax, pulmonary edema, pleural effusion or consolidation. Small right suprahilar vessel on in versus calcified granuloma projecting over the the inferior aspect of the anterior right third rib is unchanged. No acute pulmonary opacity identified. No acute osseous abnormality identified.  IMPRESSION:  No acute cardiopulmonary abnormality.   Electronically Signed   By: Odessa FlemingH  Hall M.D.   On: 10/15/2014 07:25     EKG Interpretation None      MDM   Final diagnoses:  Cough  URI (upper respiratory infection)    Medications  benzonatate (TESSALON) capsule 100 mg (not administered)   albuterol (PROVENTIL HFA;VENTOLIN HFA) 108 (90 BASE) MCG/ACT inhaler 2 puff (not administered)  albuterol (PROVENTIL) (2.5 MG/3ML) 0.083% nebulizer solution 5 mg (5 mg Nebulization Given 10/15/14 0636)  ipratropium (ATROVENT) nebulizer solution 0.5 mg (0.5 mg Nebulization Given 10/15/14 0636)    Patients symptoms significantly improved with breathing treatments. Decreased coughing and better airway movement. No wheezing, no respiratory distress. The chest xray shows no pneumonia, bronchitis, or fluid build-up. The pt has normal vitals.  Will recommend rest, fluids, continue Claritin. Will add Albuterol inhaler and Tessalon Perls.  34 y.o.Noel ChristmasMario D Oltmann's evaluation in the Emergency Department is complete. It has been determined that no acute conditions requiring further emergency intervention are present at this time. The patient/guardian have been advised of the diagnosis and plan. We have discussed signs and symptoms that warrant return to the ED, such as changes or worsening in symptoms.  Vital signs are stable at discharge. Filed Vitals:   10/15/14 0600  BP: 145/75  Pulse: 82  Temp: 98.4 F (36.9 C)  Resp: 18    Patient/guardian has voiced understanding and agreed to follow-up with the PCP or specialist.    Marlon Peliffany Aby Gessel, PA-C 10/15/14 [phone]  Marisa Severinlga Otter, MD 10/15/14 2307

## 2014-10-15 NOTE — Discharge Instructions (Signed)
Cool Mist Vaporizers °Vaporizers may help relieve the symptoms of a cough and cold. They add moisture to the air, which helps mucus to become thinner and less sticky. This makes it easier to breathe and cough up secretions. Cool mist vaporizers do not cause serious burns like hot mist vaporizers, which may also be called steamers or humidifiers. Vaporizers have not been proven to help with colds. You should not use a vaporizer if you are allergic to mold. °HOME CARE INSTRUCTIONS °· Follow the package instructions for the vaporizer. °· Do not use anything other than distilled water in the vaporizer. °· Do not run the vaporizer all of the time. This can cause mold or bacteria to grow in the vaporizer. °· Clean the vaporizer after each time it is used. °· Clean and dry the vaporizer well before storing it. °· Stop using the vaporizer if worsening respiratory symptoms develop. °Document Released: 02/19/2004 Document Revised: 05/29/2013 Document Reviewed: 10/11/2012 °ExitCare® Patient Information ©2015 [employer]. This information is not intended to replace advice given to you by your health care provider. Make sure you discuss any questions you have with your health care provider. ° °Upper Respiratory Infection, Adult °An upper respiratory infection (URI) is also sometimes known as the common cold. The upper respiratory tract includes the nose, sinuses, throat, trachea, and bronchi. Bronchi are the airways leading to the lungs. Most people improve within 1 week, but symptoms can last up to 2 weeks. A residual cough may last even longer.  °CAUSES °Many different viruses can infect the tissues lining the upper respiratory tract. The tissues become irritated and inflamed and often become very moist. Mucus production is also common. A cold is contagious. You can easily spread the virus to others by oral contact. This includes kissing, sharing a glass, coughing, or sneezing. Touching your mouth or nose and then touching a  surface, which is then touched by another person, can also spread the virus. °SYMPTOMS  °Symptoms typically develop 1 to 3 days after you come in contact with a cold virus. Symptoms vary from person to person. They may include: °· Runny nose. °· Sneezing. °· Nasal congestion. °· Sinus irritation. °· Sore throat. °· Loss of voice (laryngitis). °· Cough. °· Fatigue. °· Muscle aches. °· Loss of appetite. °· Headache. °· Low-grade fever. °DIAGNOSIS  °You might diagnose your own cold based on familiar symptoms, since most people get a cold 2 to 3 times a year. Your caregiver can confirm this based on your exam. Most importantly, your caregiver can check that your symptoms are not due to another disease such as strep throat, sinusitis, pneumonia, asthma, or epiglottitis. Blood tests, throat tests, and X-rays are not necessary to diagnose a common cold, but they may sometimes be helpful in excluding other more serious diseases. Your caregiver will decide if any further tests are required. °RISKS AND COMPLICATIONS  °You may be at risk for a more severe case of the common cold if you smoke cigarettes, have chronic heart disease (such as heart failure) or lung disease (such as asthma), or if you have a weakened immune system. The very young and very old are also at risk for more serious infections. Bacterial sinusitis, middle ear infections, and bacterial pneumonia can complicate the common cold. The common cold can worsen asthma and chronic obstructive pulmonary disease (COPD). Sometimes, these complications can require emergency medical care and may be life-threatening. °PREVENTION  °The best way to protect against getting a cold is to practice good hygiene.   Avoid oral or hand contact with people with cold symptoms. Wash your hands often if contact occurs. There is no clear evidence that vitamin C, vitamin E, echinacea, or exercise reduces the chance of developing a cold. However, it is always recommended to get plenty of  rest and practice good nutrition. °TREATMENT  °Treatment is directed at relieving symptoms. There is no cure. Antibiotics are not effective, because the infection is caused by a virus, not by bacteria. Treatment may include: °· Increased fluid intake. Sports drinks offer valuable electrolytes, sugars, and fluids. °· Breathing heated mist or steam (vaporizer or shower). °· Eating chicken soup or other clear broths, and maintaining good nutrition. °· Getting plenty of rest. °· Using gargles or lozenges for comfort. °· Controlling fevers with ibuprofen or acetaminophen as directed by your caregiver. °· Increasing usage of your inhaler if you have asthma. °Zinc gel and zinc lozenges, taken in the first 24 hours of the common cold, can shorten the duration and lessen the severity of symptoms. Pain medicines may help with fever, muscle aches, and throat pain. A variety of non-prescription medicines are available to treat congestion and runny nose. Your caregiver can make recommendations and may suggest nasal or lung inhalers for other symptoms.  °HOME CARE INSTRUCTIONS  °· Only take over-the-counter or prescription medicines for pain, discomfort, or fever as directed by your caregiver. °· Use a warm mist humidifier or inhale steam from a shower to increase air moisture. This may keep secretions moist and make it easier to breathe. °· Drink enough water and fluids to keep your urine clear or pale yellow. °· Rest as needed. °· Return to work when your temperature has returned to normal or as your caregiver advises. You may need to stay home longer to avoid infecting others. You can also use a face mask and careful hand washing to prevent spread of the virus. °SEEK MEDICAL CARE IF:  °· After the first few days, you feel you are getting worse rather than better. °· You need your caregiver's advice about medicines to control symptoms. °· You develop chills, worsening shortness of breath, or brown or red sputum. These may be  signs of pneumonia. °· You develop yellow or brown nasal discharge or pain in the face, especially when you bend forward. These may be signs of sinusitis. °· You develop a fever, swollen neck glands, pain with swallowing, or white areas in the back of your throat. These may be signs of strep throat. °SEEK IMMEDIATE MEDICAL CARE IF:  °· You have a fever. °· You develop severe or persistent headache, ear pain, sinus pain, or chest pain. °· You develop wheezing, a prolonged cough, cough up blood, or have a change in your usual mucus (if you have chronic lung disease). °· You develop sore muscles or a stiff neck. °Document Released: 11/17/2000 Document Revised: 08/16/2011 Document Reviewed: 08/29/2013 °ExitCare® Patient Information ©2015 [employer]. This information is not intended to replace advice given to you by your health care provider. Make sure you discuss any questions you have with your health care provider. ° °

## 2014-10-15 NOTE — ED Notes (Signed)
Pt c/o cough, congestion, sinus pressure and headache over the last couple of days; pt denies N/V/D; pt states that he has been coughing up phlegm that is clear to white; no difficulty breathing

## 2014-10-15 NOTE — ED Notes (Signed)
Patient ambulated to XR.

## 2014-10-15 NOTE — ED Notes (Signed)
PA at bedside.

## 2014-11-14 ENCOUNTER — Encounter (HOSPITAL_COMMUNITY): Payer: Self-pay

## 2014-11-14 ENCOUNTER — Emergency Department (HOSPITAL_COMMUNITY)
Admission: EM | Admit: 2014-11-14 | Discharge: 2014-11-14 | Disposition: A | Discharge status: Home / Self Care | Payer: BC Managed Care – PPO | Attending: Emergency Medicine | Admitting: Emergency Medicine

## 2014-11-14 ENCOUNTER — Emergency Department (HOSPITAL_COMMUNITY): Payer: BC Managed Care – PPO

## 2014-11-14 DIAGNOSIS — Y93E1 Activity, personal bathing and showering: Secondary | ICD-10-CM | POA: Diagnosis not present

## 2014-11-14 DIAGNOSIS — S79911A Unspecified injury of right hip, initial encounter: Secondary | ICD-10-CM | POA: Insufficient documentation

## 2014-11-14 DIAGNOSIS — Y998 Other external cause status: Secondary | ICD-10-CM | POA: Diagnosis not present

## 2014-11-14 DIAGNOSIS — Z87891 Personal history of nicotine dependence: Secondary | ICD-10-CM | POA: Insufficient documentation

## 2014-11-14 DIAGNOSIS — Z7982 Long term (current) use of aspirin: Secondary | ICD-10-CM | POA: Insufficient documentation

## 2014-11-14 DIAGNOSIS — S6991XA Unspecified injury of right wrist, hand and finger(s), initial encounter: Secondary | ICD-10-CM | POA: Diagnosis present

## 2014-11-14 DIAGNOSIS — Y92002 Bathroom of unspecified non-institutional (private) residence single-family (private) house as the place of occurrence of the external cause: Secondary | ICD-10-CM | POA: Diagnosis not present

## 2014-11-14 DIAGNOSIS — W01198A Fall on same level from slipping, tripping and stumbling with subsequent striking against other object, initial encounter: Secondary | ICD-10-CM | POA: Insufficient documentation

## 2014-11-14 DIAGNOSIS — S60221A Contusion of right hand, initial encounter: Secondary | ICD-10-CM | POA: Insufficient documentation

## 2014-11-14 NOTE — ED Provider Notes (Signed)
CSN: [id number]     Arrival date & time 11/14/14  0621 History   First MD Initiated Contact with Patient 11/14/14 [phone]     Chief Complaint  Patient presents with  . Wrist Pain     (Consider location/radiation/quality/duration/timing/severity/associated sxs/prior Treatment) Patient is a 34 y.o. male presenting with wrist pain. The history is provided by the patient.  Wrist Pain This is a new (was taking a shower and slipped this morning falling out of the shower onto the floor and hitting his hip on the side of bathtub) problem. The current episode started 1 to 2 hours ago. The problem occurs constantly. The problem has not changed since onset.Associated symptoms comments: Mild pain in the right hip bone from where it hit the side of the bathtub.  Landed on the right hand and continued pain and swelling in the right hand. Nothing aggravates the symptoms. Nothing relieves the symptoms. He has tried nothing for the symptoms. The treatment provided no relief.    History reviewed. No pertinent past medical history. History reviewed. No pertinent past surgical history. History reviewed. No pertinent family history. History  Substance Use Topics  . Smoking status: Former Games developer  . Smokeless tobacco: Not on file  . Alcohol Use: No    Review of Systems  Neurological: Negative for syncope.       No head injury  All other systems reviewed and are negative.     Allergies  Review of patient's allergies indicates no known allergies.  Home Medications   Prior to Admission medications   Medication Sig Start Date End Date Taking? Authorizing Provider  aspirin 325 MG tablet Take 325 mg by mouth 2 (two) times daily as needed for mild pain.   Yes Historical Provider, MD  loratadine (CLARITIN) 10 MG tablet Take 10 mg by mouth daily as needed for allergies.   Yes Historical Provider, MD  benzonatate (TESSALON) 100 MG capsule Take 1 capsule (100 mg total) by mouth every 8 (eight) hours. Patient  not taking: Reported on 11/14/2014 10/15/14   Marlon Pel, PA-C   BP 140/82 mmHg  Pulse 78  Temp(Src) 98.6 F (37 C) (Oral)  Resp 14  SpO2 98% Physical Exam  Constitutional: He appears well-developed and well-nourished. No distress.  HENT:  Head: Normocephalic and atraumatic.  Eyes: EOM are normal. Pupils are equal, round, and reactive to light.  Cardiovascular: Normal rate.   Pulmonary/Chest: Effort normal.  Abdominal: Soft. There is no tenderness. There is no rebound and no guarding.  Musculoskeletal: He exhibits tenderness.       Hands: Mild tenderness over the right hip  Nursing note and vitals reviewed.   ED Course  Procedures (including critical care time) Labs Review Labs Reviewed - No data to display  Imaging Review Dg Wrist Complete Right  11/14/2014   CLINICAL DATA:  Dorsal wrist pain after a fall.  EXAM: RIGHT WRIST - COMPLETE 3+ VIEW  COMPARISON:  Radiographs dated 08/22/2006  FINDINGS: No acute fracture or dislocation. Old deformity from a healed fracture of the base of the fifth metacarpal. 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  IMPRESSION: No acute abnormalities.   Electronically Signed   By: Francene Boyers M.D.   On: 11/14/2014 07:28   Dg Hand Complete Right  11/14/2014   CLINICAL DATA:  Wrist and hand pain after a fall.  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 No acute fracture or dislocation.  IMPRESSION: No acute abnormality.   Electronically Signed   By: Francene Boyers M.D.   On: 11/14/2014 07:29     EKG Interpretation None      MDM   Final diagnoses:  Hand contusion, right, initial encounter    Patient with a mechanical fall out of the shower today landing on his right hand onto the tile floor. Also hitting his right hip on the side of the bathtub. He was able to  really without difficulty. Denies any head injury or LOC.  Injury to the right hand with swelling and tenderness over the fourth metacarpal.  Imaging pending  7:52 AM Imaging neg.  Pt d/ced home with wrist splint  Gwyneth Sprout, MD 11/14/14 [phone]

## 2014-11-14 NOTE — ED Notes (Signed)
Pt reports falling while getting out of shower this morning, landed on right side and wrist. Pt able to move hand/wrist but with some difficulty/pain, c/o "some numbness/tingling, soreness." Circulation intact, skin warm and dry.

## 2015-07-21 ENCOUNTER — Emergency Department (HOSPITAL_COMMUNITY): Payer: BC Managed Care – PPO

## 2015-07-21 ENCOUNTER — Emergency Department (HOSPITAL_COMMUNITY)
Admission: EM | Admit: 2015-07-21 | Discharge: 2015-07-21 | Disposition: A | Discharge status: Home / Self Care | Payer: BC Managed Care – PPO | Attending: Emergency Medicine | Admitting: Emergency Medicine

## 2015-07-21 ENCOUNTER — Encounter (HOSPITAL_COMMUNITY): Payer: Self-pay | Admitting: *Deleted

## 2015-07-21 DIAGNOSIS — B349 Viral infection, unspecified: Secondary | ICD-10-CM | POA: Insufficient documentation

## 2015-07-21 DIAGNOSIS — J069 Acute upper respiratory infection, unspecified: Secondary | ICD-10-CM

## 2015-07-21 DIAGNOSIS — Z7982 Long term (current) use of aspirin: Secondary | ICD-10-CM | POA: Insufficient documentation

## 2015-07-21 MED ORDER — SODIUM CHLORIDE 0.9 % IV BOLUS (SEPSIS)
1000.0000 mL | Freq: Once | INTRAVENOUS | Status: AC
  Administered 2015-07-21: 1000 mL via INTRAVENOUS

## 2015-07-21 MED ORDER — GUAIFENESIN ER 600 MG PO TB12: 600.0000 mg | ORAL_TABLET | Freq: Two times a day (BID) | ORAL | Status: DC | PRN

## 2015-07-21 MED ORDER — FLUTICASONE PROPIONATE 50 MCG/ACT NA SUSP: 1.0000 | Freq: Every day | NASAL | Status: DC

## 2015-07-21 MED ORDER — NAPROXEN 500 MG PO TABS: 500.0000 mg | ORAL_TABLET | Freq: Two times a day (BID) | ORAL | Status: DC

## 2015-07-21 MED ORDER — OSELTAMIVIR PHOSPHATE 75 MG PO CAPS: 75.0000 mg | ORAL_CAPSULE | Freq: Two times a day (BID) | ORAL | Status: DC

## 2015-07-21 MED ORDER — ACETAMINOPHEN 500 MG PO TABS
1000.0000 mg | ORAL_TABLET | Freq: Once | ORAL | Status: AC
  Administered 2015-07-21: 1000 mg via ORAL
  Filled 2015-07-21: qty 2

## 2015-07-21 MED ORDER — BENZONATATE 100 MG PO CAPS: 100.0000 mg | ORAL_CAPSULE | Freq: Three times a day (TID) | ORAL | Status: DC

## 2015-07-21 NOTE — ED Notes (Addendum)
Pt ambulates independently and with steady gait at time of discharge. Discharge instructions and follow up information reviewed with patient. No other questions or concerns voiced at this time. RX x 5 given.

## 2015-07-21 NOTE — ED Notes (Signed)
Pt reports cough, congestion, n/v, generalized bodyaches for several days.

## 2015-07-21 NOTE — ED Provider Notes (Signed)
CSN: [id number]     Arrival date & time 07/21/15  1454 History  By signing my name below, I, Dominic Lowery, attest that this documentation has been prepared under the direction and in the presence of non-physician practitioner, Noelle Penner, PA-C. Electronically Signed: Linna Lowery, Scribe. 07/21/2015. 5:07 PM.    Chief Complaint  Patient presents with  . Influenza    The history is provided by the patient. No language interpreter was used.     HPI Comments: Dominic Lowery is a 35 y.o. male who presents to the Emergency Department complaining of sudden onset flu-like symptoms that started two days ago. He reports associated congestion, body aches, headaches, and productive cough with phlegm. Pt states that he took ibuprofen when symptoms first presented with no relief; he states that he took Mucinex the next day with mild congestion relief. He reports that he has vomited x2 since onset of symptoms. He denies being around anyone with similar symptoms. He has not had his flu shot. He denies nausea, chills, numbness, weakness, or any other associated symptoms at this time. Denies chest pain or SOB.   History reviewed. No pertinent past medical history. History reviewed. No pertinent past surgical history. No family history on file. Social History  Substance Use Topics  . Smoking status: Former Games developer  . Smokeless tobacco: None  . Alcohol Use: No    Review of Systems  Constitutional: Negative for chills.  HENT: Positive for congestion.   Respiratory: Positive for cough.   Gastrointestinal: Positive for vomiting. Negative for nausea.  Musculoskeletal: Positive for myalgias.  Neurological: Negative for weakness and numbness.  All other systems reviewed and are negative.     Allergies  Review of patient's allergies indicates no known allergies.  Home Medications   Prior to Admission medications   Medication Sig Start Date End Date Taking? Authorizing Provider  aspirin 325 MG  tablet Take 325 mg by mouth 2 (two) times daily as needed for mild pain.    Historical Provider, MD  benzonatate (TESSALON) 100 MG capsule Take 1 capsule (100 mg total) by mouth every 8 (eight) hours. Patient not taking: Reported on 11/14/2014 10/15/14   Marlon Pel, PA-C  loratadine (CLARITIN) 10 MG tablet Take 10 mg by mouth daily as needed for allergies.    Historical Provider, MD   BP 140/72 mmHg  Pulse 104  Temp(Src) 99.3 F (37.4 C) (Oral)  Resp 16  SpO2 100% Physical Exam  Constitutional: He is oriented to person, place, and time. No distress.  HENT:  Head: Atraumatic.  Right Ear: External ear normal.  Left Ear: External ear normal.  Nose: Mucosal edema and rhinorrhea present.  Eyes: Conjunctivae are normal. No scleral icterus.  Neck: Normal range of motion. Neck supple.  Cardiovascular: Normal rate and regular rhythm.   Pulmonary/Chest: Effort normal and breath sounds normal. No respiratory distress. He has no wheezes. He has no rales. He exhibits no tenderness.  Abdominal: Soft. He exhibits no distension. There is no tenderness.  Lymphadenopathy:    He has no cervical adenopathy.  Neurological: He is alert and oriented to person, place, and time.  Skin: Skin is warm and dry. He is not diaphoretic.  Psychiatric: He has a normal mood and affect. His behavior is normal.  Nursing note and vitals reviewed.  Filed Vitals:   07/21/15 1538 07/21/15 1820  BP: 140/72 145/79  Pulse: 104 78  Temp: 99.3 F (37.4 C) 99 F (37.2 C)  TempSrc: Oral Oral  Resp:  16 18  SpO2: 100% 100%     ED Course  Procedures (including critical care time)  DIAGNOSTIC STUDIES: Oxygen Saturation is 100% on RA, normal by my interpretation.    COORDINATION OF CARE:  5:14 PM Will administer fluids and Tylenol. Will order CXR. Discussed treatment plan with pt at bedside and pt agreed to plan.   Labs Review Labs Reviewed - No data to display  Imaging Review Dg Chest 2 View  07/21/2015   CLINICAL DATA:  35 year old male with sudden onset flu-like symptoms beginning a few days previously EXAM: CHEST  2 VIEW COMPARISON:  Prior chest x-ray 10/15/2014 FINDINGS: The lungs are clear and negative for focal airspace consolidation, pulmonary edema or suspicious pulmonary nodule. No pleural effusion or pneumothorax. Cardiac and mediastinal contours are within normal limits. No acute fracture or lytic or blastic osseous lesions. The visualized upper abdominal bowel gas pattern is unremarkable. IMPRESSION: Negative chest x-ray. Electronically Signed   By: Malachy Moan M.D.   On: 07/21/2015 17:57   I have personally reviewed and evaluated these images as part of my medical decision-making.   EKG Interpretation None      MDM   Final diagnoses:  URI (upper respiratory infection)  Viral syndrome    CXR negative. Likely viral URI. Given onset of symptoms within the past 48 hours we will treat empirically with Tamiflu. Pt would like to defer flu swab today. I think this is reasonable. Initial tachycardia improved with tylenol and fluids. I suspect secondary to elevated temp and pain (body aches). Rx given for tamiflu, tessalon, mucinex, naproxen, and flonase. Resource guide given for PCP f/u. ER return precautions given.   I personally performed the services described in this documentation, which was scribed in my presence. The recorded information has been reviewed and is accurate.   Carlene Coria, PA-C 07/21/15 1857  Azalia Bilis, MD 07/22/15 [phone]

## 2015-07-21 NOTE — Discharge Instructions (Signed)
Take medications as prescribed. Return to the emergency room for worsening condition or new concerning symptoms. Follow up with your regular doctor. If you don't have a regular doctor use one of the numbers below to establish a primary care doctor. ° ° °Emergency Department Resource Guide °1) Find a Doctor and Pay Out of Pocket °Although you won't have to find out who is covered by your insurance plan, it is a good idea to ask around and get recommendations. You will then need to call the office and see if the doctor you have chosen will accept you as a new patient and what types of options they offer for patients who are self-pay. Some doctors offer discounts or will set up payment plans for their patients who do not have insurance, but you will need to ask so you aren't surprised when you get to your appointment. ° °2) Contact Your Local Health Department °Not all health departments have doctors that can see patients for sick visits, but many do, so it is worth a call to see if yours does. If you don't know where your local health department is, you can check in your phone book. The CDC also has a tool to help you locate your state's health department, and many state websites also have listings of all of their local health departments. ° °3) Find a Walk-in Clinic °If your illness is not likely to be very severe or complicated, you may want to try a walk in clinic. These are popping up all over the country in pharmacies, drugstores, and shopping centers. They're usually staffed by nurse practitioners or physician assistants that have been trained to treat common illnesses and complaints. They're usually fairly quick and inexpensive. However, if you have serious medical issues or chronic medical problems, these are probably not your best option. ° °No Primary Care Doctor: °- Call [facility] at  832-8000 - they can help you locate a primary care doctor that  accepts your insurance, provides certain services,  etc. °- Physician Referral Service- 1-800-533-3463 ° °Emergency Department Resource Guide °1) Find a Doctor and Pay Out of Pocket °Although you won't have to find out who is covered by your insurance plan, it is a good idea to ask around and get recommendations. You will then need to call the office and see if the doctor you have chosen will accept you as a new patient and what types of options they offer for patients who are self-pay. Some doctors offer discounts or will set up payment plans for their patients who do not have insurance, but you will need to ask so you aren't surprised when you get to your appointment. ° °2) Contact Your Local Health Department °Not all health departments have doctors that can see patients for sick visits, but many do, so it is worth a call to see if yours does. If you don't know where your local health department is, you can check in your phone book. The CDC also has a tool to help you locate your state's health department, and many state websites also have listings of all of their local health departments. ° °3) Find a Walk-in Clinic °If your illness is not likely to be very severe or complicated, you may want to try a walk in clinic. These are popping up all over the country in pharmacies, drugstores, and shopping centers. They're usually staffed by nurse practitioners or physician assistants that have been trained to treat common illnesses and complaints. They're usually fairly   quick and inexpensive. However, if you have serious medical issues or chronic medical problems, these are probably not your best option. ° °No Primary Care Doctor: °- Call [facility] at  832-8000 - they can help you locate a primary care doctor that  accepts your insurance, provides certain services, etc. °- Physician Referral Service- 1-800-533-3463 ° °Chronic Pain Problems: °Organization         Address  Phone   Notes  °[facility]  ([phone]) [phone] Patients need to be referred by  their primary care doctor.  ° °Medication Assistance: °Organization         Address  Phone   Notes  °[place] [facility] [address] °[place], [place] [address] ([phone]) 641-8030 --Must be a resident of [place] °-- Must have NO insurance coverage whatsoever (no Medicaid/ Medicare, etc.) °-- The pt. MUST have a primary care doctor that directs their care regularly and follows them in the community °  °[facility]  (866) 331-1348   °[employer]  (888) 892-1162   ° °Agencies that provide inexpensive medical care: °Organization         Address  Phone   Notes  °[facility]  ([phone]) [phone]   °[facility]    (336) 832-7272   °[facility] [address] °[place], [place] [address] (336) 832-4777   °[facility] [address], °[place] (336) [phone]   °[facility]    (336) 373-0678   °[facility]    (336) 272-1050   °[facility] ° [address], [place] Phone:  (336) 832-4444, Fax:  (336) [phone] Hours of Operation:  9 am - 6 pm, M-F.  Also accepts Medicaid/Medicare and self-pay.  °[facility] ° [address], [place] Phone: (336) [phone], Fax: ([phone]) [phone]. Hours of Operation:  8:30 am - 5:30 pm, M-F.  Also accepts Medicaid and self-pay.  °[facility] [place] [address], [place] Phone: ([phone]) [phone]   °[facility] [address], [place], [place] (336)723-1848, Ext. 123 Mondays & Thursdays: 7-9 AM.  First 15 patients are seen on a first come, first serve basis. °  ° °Medicaid-accepting [place] Providers: ° °Organization         Address  Phone   Notes  °[facility] [address], [place] ([phone]) [phone] Also accepts self-pay patients.  °[facility] [address], [place] ° (336) [phone]   °[facility]  [address], [place] ([phone]) [phone]   °[facility] [address] [place] [address], [place] (336) [phone]   °[facility] [address], [place]  ° ([phone]) [phone] Only accepts [place] Access Medicaid patients after they have their name applied to their card.  ° °Self-Pay (no insurance) in [place]: ° °Organization         Address  Phone   Notes  °Sickle Cell Patients, [facility] [address], [place] (336) [phone]   °[facility] [address], [place] (336) [phone]   °[facility] ° [address], [place] (336) [phone]   °[facility]/Dr. Osei-Bonsu ° [address] [place] [address], [place] or [address], [place] ([phone]) [phone] Phone number   for both [place] and [place] locations is the same.  °[facility] [address], [place] ([phone]) [phone]   °[facility] [address] [place] [address], [place] or [address] ([phone]) [phone] °(336) [phone]   °[facility] [address], [place] (336) 350-1642, phone; ([phone]) [phone], fax Sees patients 1st and 3rd Saturday of every month.  Must not qualify for public or private insurance (i.e. Medicaid, Medicare, [place] Health Choice, Veterans' Benefits) • Household income should be no more than 200% of the poverty level •The clinic cannot treat you if you are pregnant or think you are pregnant • Sexually transmitted diseases are not treated at the clinic.  ° ° ° °

## 2015-10-21 ENCOUNTER — Emergency Department (HOSPITAL_COMMUNITY)
Admission: EM | Admit: 2015-10-21 | Discharge: 2015-10-21 | Disposition: A | Discharge status: Home / Self Care | Payer: BC Managed Care – PPO | Attending: Emergency Medicine | Admitting: Emergency Medicine

## 2015-10-21 ENCOUNTER — Encounter (HOSPITAL_COMMUNITY): Payer: Self-pay

## 2015-10-21 DIAGNOSIS — Z79899 Other long term (current) drug therapy: Secondary | ICD-10-CM | POA: Diagnosis not present

## 2015-10-21 DIAGNOSIS — J069 Acute upper respiratory infection, unspecified: Secondary | ICD-10-CM | POA: Insufficient documentation

## 2015-10-21 DIAGNOSIS — Z7982 Long term (current) use of aspirin: Secondary | ICD-10-CM | POA: Insufficient documentation

## 2015-10-21 DIAGNOSIS — Z791 Long term (current) use of non-steroidal anti-inflammatories (NSAID): Secondary | ICD-10-CM | POA: Diagnosis not present

## 2015-10-21 DIAGNOSIS — Z87891 Personal history of nicotine dependence: Secondary | ICD-10-CM | POA: Insufficient documentation

## 2015-10-21 DIAGNOSIS — Z7952 Long term (current) use of systemic steroids: Secondary | ICD-10-CM | POA: Insufficient documentation

## 2015-10-21 MED ORDER — FLUTICASONE PROPIONATE 50 MCG/ACT NA SUSP: 1.0000 | Freq: Every day | NASAL | Status: DC

## 2015-10-21 MED ORDER — BENZONATATE 100 MG PO CAPS: 100.0000 mg | ORAL_CAPSULE | Freq: Three times a day (TID) | ORAL | Status: DC

## 2015-10-21 NOTE — Discharge Instructions (Signed)
Upper Respiratory Infection, Adult Most upper respiratory infections (URIs) are a viral infection of the air passages leading to the lungs. A URI affects the nose, throat, and upper air passages. The most common type of URI is nasopharyngitis and is typically referred to as "the common cold." URIs run their course and usually go away on their own. Most of the time, a URI does not require medical attention, but sometimes a bacterial infection in the upper airways can follow a viral infection. This is called a secondary infection. Sinus and middle ear infections are common types of secondary upper respiratory infections. Bacterial pneumonia can also complicate a URI. A URI can worsen asthma and chronic obstructive pulmonary disease (COPD). Sometimes, these complications can require emergency medical care and may be life threatening.  CAUSES Almost all URIs are caused by viruses. A virus is a type of germ and can spread from one person to another.  RISKS FACTORS You may be at risk for a URI if:   You smoke.   You have chronic heart or lung disease.  You have a weakened defense (immune) system.   You are very young or very old.   You have nasal allergies or asthma.  You work in crowded or poorly ventilated areas.  You work in health care facilities or schools. SIGNS AND SYMPTOMS  Symptoms typically develop 2-3 days after you come in contact with a cold virus. Most viral URIs last 7-10 days. However, viral URIs from the influenza virus (flu virus) can last 14-18 days and are typically more severe. Symptoms may include:   Runny or stuffy (congested) nose.   Sneezing.   Cough.   Sore throat.   Headache.   Fatigue.   Fever.   Loss of appetite.   Pain in your forehead, behind your eyes, and over your cheekbones (sinus pain).  Muscle aches.  DIAGNOSIS  Your health care provider may diagnose a URI by:  Physical exam.  Tests to check that your symptoms are not due to  another condition such as:  Strep throat.  Sinusitis.  Pneumonia.  Asthma. TREATMENT  A URI goes away on its own with time. It cannot be cured with medicines, but medicines may be prescribed or recommended to relieve symptoms. Medicines may help:  Reduce your fever.  Reduce your cough.  Relieve nasal congestion. HOME CARE INSTRUCTIONS   Take medicines only as directed by your health care provider.   Gargle warm saltwater or take cough drops to comfort your throat as directed by your health care provider.  Use a warm mist humidifier or inhale steam from a shower to increase air moisture. This may make it easier to breathe.  Drink enough fluid to keep your urine clear or pale yellow.   Eat soups and other clear broths and maintain good nutrition.   Rest as needed.   Return to work when your temperature has returned to normal or as your health care provider advises. You may need to stay home longer to avoid infecting others. You can also use a face mask and careful hand washing to prevent spread of the virus.  Increase the usage of your inhaler if you have asthma.   Do not use any tobacco products, including cigarettes, chewing tobacco, or electronic cigarettes. If you need help quitting, ask your health care provider. PREVENTION  The best way to protect yourself from getting a cold is to practice good hygiene.   Avoid oral or hand contact with people with cold   symptoms.   Wash your hands often if contact occurs.  There is no clear evidence that vitamin C, vitamin E, echinacea, or exercise reduces the chance of developing a cold. However, it is always recommended to get plenty of rest, exercise, and practice good nutrition.  SEEK MEDICAL CARE IF:   You are getting worse rather than better.   Your symptoms are not controlled by medicine.   You have chills.  You have worsening shortness of breath.  You have brown or red mucus.  You have yellow or brown nasal  discharge.  You have pain in your face, especially when you bend forward.  You have a fever.  You have swollen neck glands.  You have pain while swallowing.  You have white areas in the back of your throat. SEEK IMMEDIATE MEDICAL CARE IF:   You have severe or persistent:  Headache.  Ear pain.  Sinus pain.  Chest pain.  You have chronic lung disease and any of the following:  Wheezing.  Prolonged cough.  Coughing up blood.  A change in your usual mucus.  You have a stiff neck.  You have changes in your:  Vision.  Hearing.  Thinking.  Mood. MAKE SURE YOU:   Understand these instructions.  Will watch your condition.  Will get help right away if you are not doing well or get worse.   This information is not intended to replace advice given to you by your health care provider. Make sure you discuss any questions you have with your health care provider.   Document Released: 11/17/2000 Document Revised: 10/08/2014 Document Reviewed: 08/29/2013 Elsevier Interactive Patient Education 2016 [employer] Inc.  

## 2015-10-21 NOTE — ED Notes (Signed)
Pt complains of generalized body aches and URI sx for two days

## 2015-10-21 NOTE — ED Provider Notes (Signed)
CSN: [id number]     Arrival date & time 10/21/15  0606 History   First MD Initiated Contact with Patient 10/21/15 [phone]     Chief Complaint  Patient presents with  . URI   HPI Comments: 35 year old male presents with URI symptoms for the past 2 days. No significant past medical history. He reports a mild headache, muscle aches, runny nose, sinus pressure, sore throat, productive cough. Denies ear pain, shortness of breath, wheezing, nausea, vomiting, diarrhea. Has not had his flu vaccine this year however he states he was treated for the flu couple months ago. He has been taking Sudafed and Alka-Seltzer with moderate relief.  Patient is a 35 y.o. male presenting with URI.  URI Presenting symptoms: congestion, cough and sore throat   Presenting symptoms: no ear pain, no fever and no rhinorrhea   Associated symptoms: no sneezing and no wheezing     History reviewed. No pertinent past medical history. History reviewed. No pertinent past surgical history. History reviewed. No pertinent family history. Social History  Substance Use Topics  . Smoking status: Former Games developermoker  . Smokeless tobacco: None  . Alcohol Use: No    Review of Systems  Constitutional: Negative for fever and chills.  HENT: Positive for congestion, sinus pressure and sore throat. Negative for ear discharge, ear pain, rhinorrhea, sneezing and trouble swallowing.   Respiratory: Positive for cough. Negative for shortness of breath and wheezing.   Cardiovascular: Negative for chest pain.  Gastrointestinal: Negative for nausea, vomiting, abdominal pain and diarrhea.  All other systems reviewed and are negative.   Allergies  Review of patient's allergies indicates no known allergies.  Home Medications   Prior to Admission medications   Medication Sig Start Date End Date Taking? Authorizing Provider  aspirin 325 MG tablet Take 325 mg by mouth 2 (two) times daily as needed for mild pain.    Historical Provider, MD   benzonatate (TESSALON) 100 MG capsule Take 1 capsule (100 mg total) by mouth every 8 (eight) hours. Patient not taking: Reported on 11/14/2014 10/15/14   Marlon Peliffany Greene, PA-C  benzonatate (TESSALON) 100 MG capsule Take 1 capsule (100 mg total) by mouth every 8 (eight) hours. 07/21/15   Ace GinsSerena Y Sam, PA-C  fluticasone (FLONASE) 50 MCG/ACT nasal spray Place 1 spray into both nostrils daily. 07/21/15   Ace GinsSerena Y Sam, PA-C  guaiFENesin (MUCINEX) 600 MG 12 hr tablet Take 1 tablet (600 mg total) by mouth 2 (two) times daily as needed for to loosen phlegm. 07/21/15   Ace GinsSerena Y Sam, PA-C  loratadine (CLARITIN) 10 MG tablet Take 10 mg by mouth daily as needed for allergies.    Historical Provider, MD  naproxen (NAPROSYN) 500 MG tablet Take 1 tablet (500 mg total) by mouth 2 (two) times daily. 07/21/15   Carlene CoriaSerena Y Sam, PA-C  oseltamivir (TAMIFLU) 75 MG capsule Take 1 capsule (75 mg total) by mouth every 12 (twelve) hours. 07/21/15   Ace GinsSerena Y Sam, PA-C   BP 127/82 mmHg  Pulse 92  Temp(Src) 98.5 F (36.9 C) (Oral)  Resp 22  Ht 6' (1.829 m)  Wt 81.647 kg  BMI 24.41 kg/m2  SpO2 100%   Physical Exam  Constitutional: He is oriented to person, place, and time. He appears well-developed and well-nourished. No distress.  HENT:  Head: Normocephalic and atraumatic.  Right Ear: Hearing, tympanic membrane, external ear and ear canal normal. No drainage.  Left Ear: Hearing, tympanic membrane, external ear and ear canal normal. No drainage.  Nose: Mucosal edema and rhinorrhea present. Right sinus exhibits maxillary sinus tenderness. Right sinus exhibits no frontal sinus tenderness. Left sinus exhibits maxillary sinus tenderness. Left sinus exhibits no frontal sinus tenderness.  Mouth/Throat: Uvula is midline, oropharynx is clear and moist and mucous membranes are normal.  Eyes: Conjunctivae are normal. Pupils are equal, round, and reactive to light. Right eye exhibits no discharge. Left eye exhibits no discharge. No  scleral icterus.  Neck: Normal range of motion. Neck supple.  Cardiovascular: Normal rate and regular rhythm.  Exam reveals no gallop and no friction rub.   No murmur heard. Pulmonary/Chest: Effort normal and breath sounds normal. No respiratory distress. He has no wheezes. He has no rales. He exhibits no tenderness.  Lymphadenopathy:    He has cervical adenopathy.  Neurological: He is alert and oriented to person, place, and time.  Skin: Skin is warm and dry.  Psychiatric: He has a normal mood and affect.    ED Course  Procedures (including critical care time) Labs Review Labs Reviewed - No data to display  Imaging Review No results found. I have personally reviewed and evaluated these images and lab results as part of my medical decision-making.   EKG Interpretation None     Meds given in ED:  Medications - No data to display  New Prescriptions   BENZONATATE (TESSALON) 100 MG CAPSULE    Take 1 capsule (100 mg total) by mouth every 8 (eight) hours.   FLUTICASONE (FLONASE) 50 MCG/ACT NASAL SPRAY    Place 1 spray into both nostrils daily.     MDM   Final diagnoses:  URI (upper respiratory infection)   35 year old male presents with URI symptoms for 2 days. He is afebrile, not tachycardic, not hypoxic. Physical exam is suggestive of viral etiology. Will treat symptomatically. Antibiotics not indicated at this time. Patient given prescription for Tessalon and Flonase. Work note given. Patient is NAD, non-toxic, with stable VS. Patient is informed of clinical course, understands medical decision making process, and agrees with plan. Opportunity for questions provided and all questions answered. Return precautions given     Bethel Born, PA-C 10/21/15 1191  Dione Booze, MD 10/21/15 2258

## 2016-02-09 ENCOUNTER — Emergency Department (HOSPITAL_COMMUNITY)
Admission: EM | Admit: 2016-02-09 | Discharge: 2016-02-09 | Disposition: A | Discharge status: Home / Self Care | Payer: BC Managed Care – PPO | Attending: Emergency Medicine | Admitting: Emergency Medicine

## 2016-02-09 ENCOUNTER — Emergency Department (HOSPITAL_COMMUNITY): Payer: BC Managed Care – PPO

## 2016-02-09 ENCOUNTER — Encounter (HOSPITAL_COMMUNITY): Payer: Self-pay | Admitting: Emergency Medicine

## 2016-02-09 DIAGNOSIS — Y92410 Unspecified street and highway as the place of occurrence of the external cause: Secondary | ICD-10-CM | POA: Diagnosis not present

## 2016-02-09 DIAGNOSIS — Y999 Unspecified external cause status: Secondary | ICD-10-CM | POA: Insufficient documentation

## 2016-02-09 DIAGNOSIS — Z79899 Other long term (current) drug therapy: Secondary | ICD-10-CM | POA: Insufficient documentation

## 2016-02-09 DIAGNOSIS — Y9389 Activity, other specified: Secondary | ICD-10-CM | POA: Diagnosis not present

## 2016-02-09 DIAGNOSIS — S39012A Strain of muscle, fascia and tendon of lower back, initial encounter: Secondary | ICD-10-CM

## 2016-02-09 DIAGNOSIS — F17210 Nicotine dependence, cigarettes, uncomplicated: Secondary | ICD-10-CM | POA: Insufficient documentation

## 2016-02-09 DIAGNOSIS — S199XXA Unspecified injury of neck, initial encounter: Secondary | ICD-10-CM | POA: Diagnosis present

## 2016-02-09 DIAGNOSIS — S161XXA Strain of muscle, fascia and tendon at neck level, initial encounter: Secondary | ICD-10-CM | POA: Diagnosis not present

## 2016-02-09 DIAGNOSIS — Z7982 Long term (current) use of aspirin: Secondary | ICD-10-CM | POA: Diagnosis not present

## 2016-02-09 MED ORDER — IBUPROFEN 800 MG PO TABS
800.0000 mg | ORAL_TABLET | Freq: Once | ORAL | Status: AC
  Administered 2016-02-09: 800 mg via ORAL
  Filled 2016-02-09: qty 1

## 2016-02-09 MED ORDER — CYCLOBENZAPRINE HCL 10 MG PO TABS: 10.0000 mg | ORAL_TABLET | Freq: Two times a day (BID) | ORAL | 0 refills | Status: DC | PRN

## 2016-02-09 MED ORDER — NAPROXEN 500 MG PO TABS
500.0000 mg | ORAL_TABLET | Freq: Two times a day (BID) | ORAL | 0 refills | Status: DC
Start: 2016-02-09 — End: 2017-09-07

## 2016-02-09 NOTE — ED Notes (Signed)
Patient is A & O x4.  He is ambulatory.  
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
## 2016-02-09 NOTE — Discharge Instructions (Signed)
Naprosyn for pain. Flexeril for muscle spasms. Try heat, stretching, follow up with primary care doctor if not improving.

## 2017-09-07 ENCOUNTER — Other Ambulatory Visit: Payer: Self-pay

## 2017-09-07 ENCOUNTER — Emergency Department (HOSPITAL_COMMUNITY): Payer: BC Managed Care – PPO

## 2017-09-07 ENCOUNTER — Emergency Department (HOSPITAL_COMMUNITY)
Admission: EM | Admit: 2017-09-07 | Discharge: 2017-09-07 | Disposition: A | Discharge status: Home / Self Care | Payer: BC Managed Care – PPO | Attending: Emergency Medicine | Admitting: Emergency Medicine

## 2017-09-07 ENCOUNTER — Encounter (HOSPITAL_COMMUNITY): Payer: Self-pay

## 2017-09-07 DIAGNOSIS — K640 First degree hemorrhoids: Secondary | ICD-10-CM | POA: Insufficient documentation

## 2017-09-07 DIAGNOSIS — R1012 Left upper quadrant pain: Secondary | ICD-10-CM | POA: Diagnosis present

## 2017-09-07 DIAGNOSIS — Z7982 Long term (current) use of aspirin: Secondary | ICD-10-CM | POA: Insufficient documentation

## 2017-09-07 DIAGNOSIS — F17290 Nicotine dependence, other tobacco product, uncomplicated: Secondary | ICD-10-CM | POA: Diagnosis not present

## 2017-09-07 DIAGNOSIS — R109 Unspecified abdominal pain: Secondary | ICD-10-CM

## 2017-09-07 DIAGNOSIS — R1013 Epigastric pain: Secondary | ICD-10-CM

## 2017-09-07 LAB — URINALYSIS, ROUTINE W REFLEX MICROSCOPIC
BILIRUBIN URINE: NEGATIVE
Glucose, UA: NEGATIVE mg/dL
HGB URINE DIPSTICK: NEGATIVE
Ketones, ur: NEGATIVE mg/dL
Leukocytes, UA: NEGATIVE
Nitrite: NEGATIVE
PH: 6 (ref 5.0–8.0)
Protein, ur: NEGATIVE mg/dL
SPECIFIC GRAVITY, URINE: 1.009 (ref 1.005–1.030)

## 2017-09-07 LAB — CBC
HEMATOCRIT: 39.3 % (ref 39.0–52.0)
HEMOGLOBIN: 13.2 g/dL (ref 13.0–17.0)
MCH: 29.9 pg (ref 26.0–34.0)
MCHC: 33.6 g/dL (ref 30.0–36.0)
MCV: 88.9 fL (ref 78.0–100.0)
Platelets: 258 10*3/uL (ref 150–400)
RBC: 4.42 MIL/uL (ref 4.22–5.81)
RDW: 13.5 % (ref 11.5–15.5)
WBC: 10.5 10*3/uL (ref 4.0–10.5)

## 2017-09-07 LAB — COMPREHENSIVE METABOLIC PANEL
ALK PHOS: 62 U/L (ref 38–126)
ALT: 21 U/L (ref 17–63)
ANION GAP: 6 (ref 5–15)
AST: 19 U/L (ref 15–41)
Albumin: 4 g/dL (ref 3.5–5.0)
BUN: 12 mg/dL (ref 6–20)
CALCIUM: 9 mg/dL (ref 8.9–10.3)
CHLORIDE: 107 mmol/L (ref 101–111)
CO2: 27 mmol/L (ref 22–32)
Creatinine, Ser: 0.98 mg/dL (ref 0.61–1.24)
GFR calc non Af Amer: >60 mL/min (ref >60)
GLUCOSE: 102 mg/dL — AB (ref 65–99)
POTASSIUM: 3.7 mmol/L (ref 3.5–5.1)
SODIUM: 140 mmol/L (ref 135–145)
Total Bilirubin: 0.5 mg/dL (ref 0.3–1.2)
Total Protein: 7.2 g/dL (ref 6.5–8.1)

## 2017-09-07 LAB — LIPASE, BLOOD: LIPASE: 46 U/L (ref 11–51)

## 2017-09-07 MED ORDER — SODIUM CHLORIDE 0.9 % IV BOLUS
1000.0000 mL | Freq: Once | INTRAVENOUS | Status: AC
  Administered 2017-09-07: 1000 mL via INTRAVENOUS

## 2017-09-07 MED ORDER — PANTOPRAZOLE SODIUM 40 MG IV SOLR
40.0000 mg | Freq: Once | INTRAVENOUS | Status: AC
  Administered 2017-09-07: 40 mg via INTRAVENOUS
  Filled 2017-09-07: qty 40

## 2017-09-07 MED ORDER — GI COCKTAIL ~~LOC~~
30.0000 mL | Freq: Once | ORAL | Status: AC
  Administered 2017-09-07: 30 mL via ORAL
  Filled 2017-09-07: qty 30

## 2017-09-07 MED ORDER — SUCRALFATE 1 G PO TABS: 1.0000 g | ORAL_TABLET | Freq: Three times a day (TID) | ORAL | 0 refills | Status: DC

## 2017-09-07 MED ORDER — DOCUSATE SODIUM 250 MG PO CAPS: 250.0000 mg | ORAL_CAPSULE | Freq: Every day | ORAL | 0 refills | Status: AC

## 2017-09-07 MED ORDER — OMEPRAZOLE 20 MG PO CPDR: 20.0000 mg | DELAYED_RELEASE_CAPSULE | Freq: Every day | ORAL | 0 refills | Status: DC
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
## 2017-09-07 NOTE — ED Notes (Signed)
Bed: [facility]07 Expected date:  Expected time:  Means of arrival:  Comments: 

## 2017-09-07 NOTE — ED Notes (Signed)
Patient transported to X-ray 

## 2017-09-07 NOTE — ED Triage Notes (Signed)
Patient c/o intermittent left upper abdominal pain x 7 months.Patient states he has seen "drops of bright red blood " in his stool at times. No vomiting recently.

## 2017-09-07 NOTE — Discharge Instructions (Addendum)
Avoid taking any ASPIRIN, ADVIL, NAPROXEN, IBUPROFEN, or other NSAID medications, as these can cause ulcers.  Take TYLENOL or ACETAMINOPHEN for pain.  Follow-up with a GI (stomach) doctor in 1-2 weeks.

## 2018-02-10 ENCOUNTER — Emergency Department (HOSPITAL_COMMUNITY)
Admission: EM | Admit: 2018-02-10 | Discharge: 2018-02-10 | Disposition: A | Discharge status: Home / Self Care | Payer: BC Managed Care – PPO | Attending: Emergency Medicine | Admitting: Emergency Medicine

## 2018-02-10 ENCOUNTER — Encounter (HOSPITAL_COMMUNITY): Payer: Self-pay | Admitting: Emergency Medicine

## 2018-02-10 DIAGNOSIS — Z79899 Other long term (current) drug therapy: Secondary | ICD-10-CM | POA: Diagnosis not present

## 2018-02-10 DIAGNOSIS — R51 Headache: Secondary | ICD-10-CM | POA: Diagnosis not present

## 2018-02-10 DIAGNOSIS — R519 Headache, unspecified: Secondary | ICD-10-CM

## 2018-02-10 DIAGNOSIS — R112 Nausea with vomiting, unspecified: Secondary | ICD-10-CM | POA: Diagnosis not present

## 2018-02-10 DIAGNOSIS — F17210 Nicotine dependence, cigarettes, uncomplicated: Secondary | ICD-10-CM | POA: Insufficient documentation

## 2018-02-10 DIAGNOSIS — M542 Cervicalgia: Secondary | ICD-10-CM | POA: Diagnosis not present

## 2018-02-10 LAB — CBC
HCT: 40.4 % (ref 39.0–52.0)
Hemoglobin: 13.5 g/dL (ref 13.0–17.0)
MCH: 29.4 pg (ref 26.0–34.0)
MCHC: 33.4 g/dL (ref 30.0–36.0)
MCV: 88 fL (ref 78.0–100.0)
PLATELETS: 271 10*3/uL (ref 150–400)
RBC: 4.59 MIL/uL (ref 4.22–5.81)
RDW: 12.7 % (ref 11.5–15.5)
WBC: 6.6 10*3/uL (ref 4.0–10.5)

## 2018-02-10 LAB — URINALYSIS, ROUTINE W REFLEX MICROSCOPIC
BACTERIA UA: NONE SEEN
BILIRUBIN URINE: NEGATIVE
Glucose, UA: NEGATIVE mg/dL
Ketones, ur: NEGATIVE mg/dL
LEUKOCYTES UA: NEGATIVE
Nitrite: NEGATIVE
Protein, ur: NEGATIVE mg/dL
SPECIFIC GRAVITY, URINE: 1.025 (ref 1.005–1.030)
pH: 6 (ref 5.0–8.0)

## 2018-02-10 LAB — COMPREHENSIVE METABOLIC PANEL
ALBUMIN: 4.2 g/dL (ref 3.5–5.0)
ALK PHOS: 62 U/L (ref 38–126)
ALT: 22 U/L (ref 0–44)
AST: 23 U/L (ref 15–41)
Anion gap: 11 (ref 5–15)
BUN: 16 mg/dL (ref 6–20)
CALCIUM: 9.3 mg/dL (ref 8.9–10.3)
CO2: 24 mmol/L (ref 22–32)
CREATININE: 1.14 mg/dL (ref 0.61–1.24)
Chloride: 105 mmol/L (ref 98–111)
GFR calc Af Amer: >60 mL/min (ref >60)
GFR calc non Af Amer: >60 mL/min (ref >60)
GLUCOSE: 109 mg/dL — AB (ref 70–99)
Potassium: 4 mmol/L (ref 3.5–5.1)
Sodium: 140 mmol/L (ref 135–145)
Total Bilirubin: 0.9 mg/dL (ref 0.3–1.2)
Total Protein: 7.6 g/dL (ref 6.5–8.1)

## 2018-02-10 MED ORDER — SODIUM CHLORIDE 0.9 % IV BOLUS
1000.0000 mL | Freq: Once | INTRAVENOUS | Status: AC
  Administered 2018-02-10: 1000 mL via INTRAVENOUS

## 2018-02-10 MED ORDER — ONDANSETRON 4 MG PO TBDP: 4.0000 mg | ORAL_TABLET | Freq: Three times a day (TID) | ORAL | 0 refills | Status: DC | PRN

## 2018-02-10 MED ORDER — DIPHENHYDRAMINE HCL 50 MG/ML IJ SOLN
25.0000 mg | Freq: Once | INTRAMUSCULAR | Status: AC
  Administered 2018-02-10: 25 mg via INTRAVENOUS
  Filled 2018-02-10: qty 1

## 2018-02-10 MED ORDER — ONDANSETRON HCL 4 MG/2ML IJ SOLN: 4.0000 mg | Freq: Once | INTRAMUSCULAR | Status: DC

## 2018-02-10 MED ORDER — METOCLOPRAMIDE HCL 5 MG/ML IJ SOLN
10.0000 mg | Freq: Once | INTRAMUSCULAR | Status: AC
  Administered 2018-02-10: 10 mg via INTRAVENOUS
  Filled 2018-02-10: qty 2

## 2018-02-10 NOTE — ED Triage Notes (Signed)
Patient reports multiple emesis this week with headache , denies fever or diarrhea .

## 2018-02-10 NOTE — Discharge Instructions (Signed)
Please read and follow all provided instructions.  Your diagnoses today include:  1. Non-intractable vomiting with nausea, unspecified vomiting type   2. Acute nonintractable headache, unspecified headache type   3. Neck pain on right side     Tests performed today include:  Blood counts and electrolytes  Blood tests to check liver and kidney function  Urine test to look for infection  Vital signs. See below for your results today.   Medications prescribed:   Zofran (ondansetron) - for nausea and vomiting  Take any prescribed medications only as directed.  Home care instructions:   Follow any educational materials contained in this packet.   Drink clear liquids for the next 24 hours and introduce solid foods slowly after 24 hours using the b.r.a.t. diet (Bananas, Rice, Applesauce, Toast, Yogurt).    Follow-up instructions: Please follow-up with your primary care provider in the next 2 days for further evaluation of your symptoms. If you are not feeling better in 48 hours you may have a condition that is more serious and you need re-evaluation.   Return instructions:  SEEK IMMEDIATE MEDICAL ATTENTION IF:  If you have pain that does not go away or becomes severe   You have worsening headache or trouble with your vision or movement of your arms or legs  You have a severe headache and pass out  A temperature above 101F develops   Repeated vomiting occurs (multiple episodes)   If you have pain that becomes localized to portions of the abdomen. The right side could possibly be appendicitis. In an adult, the left lower portion of the abdomen could be colitis or diverticulitis.   Blood is being passed in stools or vomit (bright red or black tarry stools)   You develop chest pain, difficulty breathing, dizziness or fainting, or become confused, poorly responsive, or inconsolable (young children)  If you have any other emergent concerns regarding your health   Your vital  signs today were: BP 128/83    Pulse 69    Temp 98.6 F (37 C) (Oral)    Resp 18    Ht 6\' 1"  (1.854 m)    Wt 81.6 kg    SpO2 100%    BMI 23.75 kg/m  If your blood pressure (bp) was elevated above 135/85 this visit, please have this repeated by your doctor within one month. --------------

## 2018-02-10 NOTE — ED Notes (Signed)
Pt given water to drink. 

## 2018-02-10 NOTE — ED Provider Notes (Addendum)
[facility] EMERGENCY DEPARTMENT Provider Note   CSN: [id number] Arrival date & time: 02/10/18  3818     History   Chief Complaint Chief Complaint  Patient presents with  . Emesis    HPI Dominic Lowery is a 37 y.o. male.  Patient with no past surgical history presents the emergency department today with vomiting, right-sided neck pain and headache.  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  Upon returning home from work 2 days ago he developed nausea and vomiting.  He had several episodes of vomiting since that time and continued vomiting this morning.  Vomiting is nonbloody, nonbilious.  He has had no associated diarrhea or constipation.  No abdominal pain, chest pain, or shortness of breath.  After the vomiting started, patient developed pain that is worse with movement in the right side of his neck.  He also developed a generalized headache.  He denies alcohol use or heavy NSAID use.  No treatments prior to arrival.  No weakness, numbness, or tingling in his arms or his legs.  No vision changes.  Onset of symptoms acute.  Course is intermittent.     History reviewed. No pertinent past medical history.  There are no active problems to display for this patient.   History reviewed. No pertinent surgical history.      Home Medications    Prior to Admission medications   Medication Sig Start Date End Date Taking? Authorizing Provider  acetaminophen (TYLENOL) 325 MG tablet Take 650 mg by mouth every 6 (six) hours as needed for mild pain or headache.   Yes [provider]  Pheniramine-PE-APAP (THERAFLU COLD & SORE THROAT) 20-10-325 MG PACK Take 1 Package by mouth 3 times/day as needed-between meals & bedtime (for cough and pain).   Yes [provider]  omeprazole (PRILOSEC) 20 MG capsule Take 1 capsule (20 mg total) by mouth daily for 14 days. Patient not taking: Reported on 02/10/2018 09/07/17 02/10/26  Shaune Pollack, MD  sucralfate (CARAFATE) 1 g tablet Take 1 tablet (1 g total) by mouth 4 (four) times daily -  with meals and at bedtime. Patient not taking: Reported on 02/10/2018 09/07/17 02/10/26  Shaune Pollack, MD    Family History Family History  Problem Relation Age of Onset  . Anemia Mother     Social History Social History   Tobacco Use  . Smoking status: Current Some Day Smoker    Types: Cigars  . Smokeless tobacco: Never Used  Substance Use Topics  . Alcohol use: Yes    Comment: occasionally  . Drug use: Not Currently    Types: Marijuana     Allergies   Patient has no known allergies.   Review of Systems Review of Systems  Constitutional: Negative for appetite change and fever.  HENT: Negative for rhinorrhea and sore throat.   Eyes: Negative for redness.  Respiratory: Negative for cough.   Cardiovascular: Negative for chest pain.  Gastrointestinal: Positive for nausea and vomiting. Negative for abdominal pain, blood in stool and diarrhea.       Negative for hematemesis  Genitourinary: Negative for dysuria.  Musculoskeletal: Positive for neck pain. Negative for myalgias.  Skin: Negative for rash.  Neurological: Positive for headaches. Negative for light-headedness.     Physical Exam Updated Vital Signs BP 128/83   Pulse 69   Temp 98.6 F (37 C) (Oral)   Resp 18   Ht 6\' 1"  (1.854 m)  Wt 81.6 kg   SpO2 100%   BMI 23.75 kg/m   Physical Exam  Constitutional: He is oriented to person, place, and time. He appears well-developed and well-nourished.  HENT:  Head: Normocephalic and atraumatic.  Right Ear: Tympanic membrane, external ear and ear canal normal.  Left Ear: Tympanic membrane, external ear and ear canal normal.  Nose: Nose normal.  Mouth/Throat: Uvula is midline, oropharynx is clear and moist and mucous membranes are normal.  Eyes: Pupils are equal, round, and reactive to light. Conjunctivae, EOM and lids are normal. Right eye exhibits no  discharge. Left eye exhibits no discharge.  Neck: Normal range of motion. Neck supple.  Full range of motion of neck with some discomfort when looking to the right.  No meningismus on exam.  Cardiovascular: Normal rate, regular rhythm and normal heart sounds.  Pulmonary/Chest: Effort normal and breath sounds normal.  Abdominal: Soft. There is no tenderness. There is no rebound and no guarding.  Musculoskeletal: Normal range of motion.       Right shoulder: Normal.       Left shoulder: Normal.       Cervical back: He exhibits tenderness. He exhibits normal range of motion and no bony tenderness.       Thoracic back: He exhibits normal range of motion, no tenderness and no bony tenderness.       Back:  Neurological: He is alert and oriented to person, place, and time. He has normal strength and normal reflexes. No cranial nerve deficit or sensory deficit. He exhibits normal muscle tone. He displays a negative Romberg sign. Coordination and gait normal. GCS eye subscore is 4. GCS verbal subscore is 5. GCS motor subscore is 6.  Skin: Skin is warm and dry.  Psychiatric: He has a normal mood and affect.  Nursing note and vitals reviewed.    ED Treatments / Results  Labs (all labs ordered are listed, but only abnormal results are displayed) Labs Reviewed  COMPREHENSIVE METABOLIC PANEL - Abnormal; Notable for the following components:      Result Value   Glucose, Bld 109 (*)    All other components within normal limits  URINALYSIS, ROUTINE W REFLEX MICROSCOPIC - Abnormal; Notable for the following components:   Hgb urine dipstick SMALL (*)    All other components within normal limits  CBC    EKG None  Radiology No results found.  Procedures Procedures (including critical care time)  Medications Ordered in ED Medications  sodium chloride 0.9 % bolus 1,000 mL (0 mLs Intravenous Stopped 02/10/18 0718)  metoCLOPramide (REGLAN) injection 10 mg (10 mg Intravenous Given 02/10/18 0627)    diphenhydrAMINE (BENADRYL) injection 25 mg (25 mg Intravenous Given 02/10/18 1610)     Initial Impression / Assessment and Plan / ED Course  I have reviewed the triage vital signs and the nursing notes.  Pertinent labs & imaging results that were available during my care of the patient were reviewed by me and considered in my medical decision making (see chart for details).     Patient seen and examined. Work-up initiated. Medications ordered.   Vital signs reviewed and are as follows: BP 128/83   Pulse 69   Temp 98.6 F (37 C) (Oral)   Resp 18   Ht 6\' 1"  (1.854 m)   Wt 81.6 kg   SpO2 100%   BMI 23.75 kg/m   8:22 AM lab work-up is reassuring.  Patient reexamined.  Headache is improved however he still continues to  have neck pain which is readily reproducible with palpation in the right neck area.  Suspect musculoskeletal pain.  Will give oral fluid challenge with water.  Anticipate discharge home with antiemetic if doing well.  8:31 AM patient continues to be improved.  He has not vomited after drinking water.  Discussed clear liquids and brat diet.  Discussed treatment for likely musculoskeletal neck pain.  Discussed precautions and signs and symptoms to return including worsening severe headache, trouble with vision, trouble with movement or sensation in extremities, and trouble talking.  Also discussed return with development of fever, abdominal pain, or intractable vomiting despite use of medication.  Patient verbalizes understanding agrees with plan.  Patient's wife is at bedside.  Final Clinical Impressions(s) / ED Diagnoses   Final diagnoses:  Non-intractable vomiting with nausea, unspecified vomiting type  Acute nonintractable headache, unspecified headache type  Neck pain on right side   Nausea and vomiting: No abdominal pain associated or diarrhea.  Lab work is reassuring with normal white blood cell count.  No left upper quadrant pain to suggest pancreatitis.  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  Symptoms well controlled in the emergency department with Reglan and patient is now holding down water without any difficulty.  HA: Patient without high-risk features of headache including: sudden onset/thunderclap HA, no similar headache in past, altered mental status, accompanying seizure, headache with exertion, age > 86, history of immunocompromise, fever, use of anticoagulation, family history of spontaneous SAH, concomitant drug use, toxic exposure.   Patient has a normal complete neurological exam, normal vital signs, normal level of consciousness, no signs of meningismus, is well-appearing/non-toxic appearing, no signs of trauma.   Imaging with CT/MRI not indicated given history and physical exam findings.   No dangerous or life-threatening conditions suspected or identified by history, physical exam, and by work-up. No indications for hospitalization identified.   Neck pain: This seems musculoskeletal in nature.  This started after vomiting began.  Pain is worse with palpation and movement.  Patient has no neurological findings.  I have low concern for carotid or vertebral artery dissection at this point.  Certainly no concern for meningitis given lack of meningismus on exam, confusion, fever.  Patient counseled to use conservative measures such as heat, Tylenol on this area for the time being and return if any symptoms worsen or change.   ED Discharge Orders         Ordered    ondansetron (ZOFRAN ODT) 4 MG disintegrating tablet  Every 8 hours PRN     02/10/18 0828           Renne Crigler, PA-C 02/10/18 0841    Renne Crigler, PA-C 02/10/18 0841    Terrilee Files, MD 02/11/18 [phone]

## 2018-02-10 NOTE — ED Notes (Signed)
Patient given discharge instructions and verbalized understanding.  Patient stable to discharge at this time.  Patient is alert and oriented to baseline.  No distressed noted at this time.  All belongings taken with the patient at discharge.   

## 2018-07-04 ENCOUNTER — Encounter (HOSPITAL_COMMUNITY): Payer: Self-pay | Admitting: *Deleted

## 2018-07-04 ENCOUNTER — Emergency Department (HOSPITAL_COMMUNITY)
Admission: EM | Admit: 2018-07-04 | Discharge: 2018-07-04 | Disposition: A | Discharge status: Home / Self Care | Payer: BC Managed Care – PPO | Attending: Emergency Medicine | Admitting: Emergency Medicine

## 2018-07-04 DIAGNOSIS — F17290 Nicotine dependence, other tobacco product, uncomplicated: Secondary | ICD-10-CM | POA: Diagnosis not present

## 2018-07-04 DIAGNOSIS — J069 Acute upper respiratory infection, unspecified: Secondary | ICD-10-CM | POA: Diagnosis not present

## 2018-07-04 DIAGNOSIS — R0981 Nasal congestion: Secondary | ICD-10-CM | POA: Diagnosis present

## 2018-07-04 MED ORDER — ONDANSETRON HCL 4 MG PO TABS: 4.0000 mg | ORAL_TABLET | Freq: Four times a day (QID) | ORAL | 0 refills | Status: DC

## 2018-07-04 MED ORDER — KETOROLAC TROMETHAMINE 30 MG/ML IJ SOLN
30.0000 mg | Freq: Once | INTRAMUSCULAR | Status: AC
  Administered 2018-07-04: 30 mg via INTRAMUSCULAR
  Filled 2018-07-04: qty 1

## 2018-07-04 NOTE — ED Provider Notes (Signed)
[facility] EMERGENCY DEPARTMENT Provider Note   CSN: [id number] Arrival date & time: 07/04/18  6503    History   Chief Complaint Chief Complaint  Patient presents with  . Headache  . Nasal Congestion  . Weakness    HPI Dominic Lowery is a 38 y.o. male.  HPI   38 year old male presents today with complaints of respiratory infection.  Patient notes that 4 days ago he developed rhinorrhea nasal congestion and nonproductive cough.  Patient notes no fever at home.  He notes feeling extremely tired with generalized headache with no neurological deficits.  He reports having some nausea and vomiting over the last several days that has improved.  He is now able to tolerate crackers.  He notes that he is improving but has not been to work.  He did not receive a flu vaccine.  He has no history of asthma or any chronic health conditions.  He notes taking TheraFlu prior to evaluation today.      History reviewed. No pertinent past medical history.  There are no active problems to display for this patient.   No past surgical history on file.      Home Medications    Prior to Admission medications   Medication Sig Start Date End Date Taking? Authorizing Provider  acetaminophen (TYLENOL) 325 MG tablet Take 650 mg by mouth every 6 (six) hours as needed for mild pain or headache.    [provider]  ondansetron (ZOFRAN ODT) 4 MG disintegrating tablet Take 1 tablet (4 mg total) by mouth every 8 (eight) hours as needed for nausea or vomiting. 02/10/18   Renne Crigler, PA-C  ondansetron (ZOFRAN) 4 MG tablet Take 1 tablet (4 mg total) by mouth every 6 (six) hours. 07/04/18   Arliss Hepburn, Tinnie Gens, PA-C  Pheniramine-PE-APAP [facility] COLD & SORE THROAT) 20-10-325 MG PACK Take 1 Package by mouth 3 times/day as needed-between meals & bedtime (for cough and pain).    [provider]    Family History Family History  Problem Relation Age of Onset  . Anemia  Mother     Social History Social History   Tobacco Use  . Smoking status: Current Some Day Smoker    Types: Cigars  . Smokeless tobacco: Never Used  Substance Use Topics  . Alcohol use: Yes    Comment: occasionally  . Drug use: Not Currently    Types: Marijuana     Allergies   Patient has no known allergies.   Review of Systems Review of Systems  All other systems reviewed and are negative.    Physical Exam Updated Vital Signs BP (!) 141/98   Pulse 62   Temp 98.6 F (37 C) (Oral)   Resp 16   Ht 6\' 1"  (1.854 m)   Wt 83.9 kg   SpO2 100%   BMI 24.41 kg/m   Physical Exam Vitals signs and nursing note reviewed.  Constitutional:      Appearance: He is well-developed.  HENT:     Head: Normocephalic and atraumatic.     Comments: Bilateral TMs normal, oropharynx clear with no erythema exudate Eyes:     General: No scleral icterus.       Right eye: No discharge.        Left eye: No discharge.     Conjunctiva/sclera: Conjunctivae normal.     Pupils: Pupils are equal, round, and reactive to light.  Neck:     Musculoskeletal: Normal range of motion.  Vascular: No JVD.     Trachea: No tracheal deviation.  Cardiovascular:     Rate and Rhythm: Regular rhythm. Tachycardia present.  Pulmonary:     Effort: Pulmonary effort is normal. No respiratory distress.     Breath sounds: No stridor. No wheezing, rhonchi or rales.  Neurological:     Mental Status: He is alert and oriented to person, place, and time.     Coordination: Coordination normal.  Psychiatric:        Behavior: Behavior normal.        Thought Content: Thought content normal.        Judgment: Judgment normal.      ED Treatments / Results  Labs (all labs ordered are listed, but only abnormal results are displayed) Labs Reviewed - No data to display  EKG None  Radiology No results found.  Procedures Procedures (including critical care time)  Medications Ordered in ED Medications    ketorolac (TORADOL) 30 MG/ML injection 30 mg (30 mg Intramuscular Given 07/04/18 1150)     Initial Impression / Assessment and Plan / ED Course  I have reviewed the triage vital signs and the nursing notes.  Pertinent labs & imaging results that were available during my care of the patient were reviewed by me and considered in my medical decision making (see chart for details).     38 year old male presents today with likely viral URI.  Very low suspicion for influenza.  He is well-appearing.  Tolerating p.o.  He will be discharged home with Zofran as needed for nausea, Toradol here for headache.  No signs of meningitis.  Discharged with symptomatic care and strict return precautions.  He verbalized understanding and agreement to today's plan.  Final Clinical Impressions(s) / ED Diagnoses   Final diagnoses:  Viral upper respiratory tract infection    ED Discharge Orders         Ordered    ondansetron (ZOFRAN) 4 MG tablet  Every 6 hours     07/04/18 1146           Eyvonne Mechanic, PA-C 07/04/18 1310    Doug Sou, MD 07/04/18 1358

## 2018-07-04 NOTE — Discharge Instructions (Addendum)
Please read attached information. If you experience any new or worsening signs or symptoms please return to the emergency room for evaluation. Please follow-up with your primary care provider or specialist as discussed. Please use medication prescribed only as directed and discontinue taking if you have any concerning signs or symptoms.   °

## 2018-07-04 NOTE — ED Triage Notes (Signed)
To ED for eval of HA, congestion, low energy, and vomiting for past few days. 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 Last meal was last pm but vomiting soon after. OTC meds taken

## 2018-07-13 ENCOUNTER — Other Ambulatory Visit: Payer: Self-pay

## 2018-07-13 ENCOUNTER — Emergency Department (HOSPITAL_COMMUNITY)
Admission: EM | Admit: 2018-07-13 | Discharge: 2018-07-13 | Disposition: A | Discharge status: Home / Self Care | Payer: BC Managed Care – PPO | Attending: Emergency Medicine | Admitting: Emergency Medicine

## 2018-07-13 ENCOUNTER — Emergency Department (HOSPITAL_COMMUNITY): Payer: BC Managed Care – PPO

## 2018-07-13 ENCOUNTER — Encounter (HOSPITAL_COMMUNITY): Payer: Self-pay

## 2018-07-13 DIAGNOSIS — S8001XA Contusion of right knee, initial encounter: Secondary | ICD-10-CM | POA: Diagnosis not present

## 2018-07-13 DIAGNOSIS — S8991XA Unspecified injury of right lower leg, initial encounter: Secondary | ICD-10-CM | POA: Diagnosis present

## 2018-07-13 DIAGNOSIS — Y92410 Unspecified street and highway as the place of occurrence of the external cause: Secondary | ICD-10-CM | POA: Insufficient documentation

## 2018-07-13 DIAGNOSIS — Y9389 Activity, other specified: Secondary | ICD-10-CM | POA: Insufficient documentation

## 2018-07-13 DIAGNOSIS — F17290 Nicotine dependence, other tobacco product, uncomplicated: Secondary | ICD-10-CM | POA: Diagnosis not present

## 2018-07-13 DIAGNOSIS — Y999 Unspecified external cause status: Secondary | ICD-10-CM | POA: Diagnosis not present

## 2018-07-13 MED ORDER — IBUPROFEN 200 MG PO TABS
600.0000 mg | ORAL_TABLET | Freq: Once | ORAL | Status: AC
  Administered 2018-07-13: 600 mg via ORAL
  Filled 2018-07-13: qty 3

## 2018-07-13 MED ORDER — NAPROXEN 500 MG PO TABS: 500.0000 mg | ORAL_TABLET | Freq: Two times a day (BID) | ORAL | 0 refills | Status: DC

## 2018-07-13 NOTE — Discharge Instructions (Addendum)
Wear the knee brace for comfort. Take the medication as directed. Follow up with your doctor or return here for worsening symptoms.
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
## 2018-07-24 ENCOUNTER — Other Ambulatory Visit: Payer: Self-pay

## 2018-07-24 ENCOUNTER — Emergency Department (HOSPITAL_COMMUNITY): Payer: BC Managed Care – PPO

## 2018-07-24 ENCOUNTER — Emergency Department (HOSPITAL_COMMUNITY)
Admission: EM | Admit: 2018-07-24 | Discharge: 2018-07-24 | Disposition: A | Discharge status: Home / Self Care | Payer: BC Managed Care – PPO | Attending: Emergency Medicine | Admitting: Emergency Medicine

## 2018-07-24 DIAGNOSIS — Y92002 Bathroom of unspecified non-institutional (private) residence single-family (private) house as the place of occurrence of the external cause: Secondary | ICD-10-CM | POA: Insufficient documentation

## 2018-07-24 DIAGNOSIS — W182XXA Fall in (into) shower or empty bathtub, initial encounter: Secondary | ICD-10-CM | POA: Insufficient documentation

## 2018-07-24 DIAGNOSIS — Y999 Unspecified external cause status: Secondary | ICD-10-CM | POA: Insufficient documentation

## 2018-07-24 DIAGNOSIS — F17290 Nicotine dependence, other tobacco product, uncomplicated: Secondary | ICD-10-CM | POA: Insufficient documentation

## 2018-07-24 DIAGNOSIS — Y93E1 Activity, personal bathing and showering: Secondary | ICD-10-CM | POA: Diagnosis not present

## 2018-07-24 DIAGNOSIS — S63045A Dislocation of carpometacarpal joint of left thumb, initial encounter: Secondary | ICD-10-CM | POA: Insufficient documentation

## 2018-07-24 DIAGNOSIS — S6992XA Unspecified injury of left wrist, hand and finger(s), initial encounter: Secondary | ICD-10-CM | POA: Diagnosis present

## 2018-07-24 DIAGNOSIS — S63055A Dislocation of other carpometacarpal joint of left hand, initial encounter: Secondary | ICD-10-CM

## 2018-07-24 MED ORDER — FENTANYL CITRATE (PF) 100 MCG/2ML IJ SOLN
100.0000 ug | Freq: Once | INTRAMUSCULAR | Status: AC
  Administered 2018-07-24: 100 ug via INTRAVENOUS
  Filled 2018-07-24: qty 2

## 2018-07-24 NOTE — ED Notes (Signed)
Ortho tech paged for splint.

## 2018-07-24 NOTE — ED Provider Notes (Signed)
[facility]-EMERGENCY DEPT Provider Note   CSN: [id number] Arrival date & time: 07/24/18  7169     History   Chief Complaint Chief Complaint  Patient presents with  . Hand Injury    HPI Dominic Lowery is a 38 y.o. male.  38 yo M with a chief complaint of left thumb pain.  The patient is a right-hand-dominant individual who was taking a shower and slipped and landed on a flexed thumb.  Since that he had pain and swelling to the area and has had trouble using that hand.  He denies other injury in the fall.  Pain with movement palpation or twisting.  Feels he is having trouble making it go in a circle so came to the ED for evaluation today.  The history is provided by the patient.  Hand Injury  Location:  Finger Finger location:  L thumb Injury: yes   Time since incident:  2 days Mechanism of injury: fall   Fall:    Fall occurred:  Standing   Impact surface:  Hard floor   Point of impact:  Hands   Entrapped after fall: no   Pain details:    Quality:  Cramping and aching   Radiates to:  Does not radiate   Severity:  Moderate   Onset quality:  Sudden   Duration:  2 days   Timing:  Constant   Progression:  Worsening Handedness:  Right-handed Dislocation: yes   Foreign body present:  No foreign bodies Tetanus status:  Unknown Prior injury to area:  No Relieved by:  Nothing Worsened by:  Nothing Ineffective treatments:  None tried Associated symptoms: no fever     No past medical history on file.  There are no active problems to display for this patient.   No past surgical history on file.      Home Medications    Prior to Admission medications   Medication Sig Start Date End Date Taking? Authorizing Provider  acetaminophen (TYLENOL) 325 MG tablet Take 650 mg by mouth every 6 (six) hours as needed for mild pain or headache.    [provider]  naproxen (NAPROSYN) 500 MG tablet Take 1 tablet (500 mg total) by mouth 2 (two) times  daily. 07/13/18   Janne Napoleon, NP  Pheniramine-PE-APAP [facility] COLD & SORE THROAT) 20-10-325 MG PACK Take 1 Package by mouth 3 times/day as needed-between meals & bedtime (for cough and pain).    [provider]    Family History Family History  Problem Relation Age of Onset  . Anemia Mother     Social History Social History   Tobacco Use  . Smoking status: Current Some Day Smoker    Types: Cigars  . Smokeless tobacco: Never Used  Substance Use Topics  . Alcohol use: Yes    Comment: occasionally  . Drug use: Not Currently    Types: Marijuana     Allergies   Patient has no known allergies.   Review of Systems Review of Systems  Constitutional: Negative for chills and fever.  HENT: Negative for congestion and facial swelling.   Eyes: Negative for discharge and visual disturbance.  Respiratory: Negative for shortness of breath.   Cardiovascular: Negative for chest pain and palpitations.  Gastrointestinal: Negative for abdominal pain, diarrhea and vomiting.  Musculoskeletal: Positive for arthralgias and myalgias.  Skin: Negative for color change and rash.  Neurological: Negative for tremors, syncope and headaches.  Psychiatric/Behavioral: Negative for confusion and dysphoric mood.  Physical Exam Updated Vital Signs BP (!) 114/96 (BP Location: Left Arm)   Pulse 83   Temp 98 F (36.7 C) (Oral)   Resp 18   Ht 5\' 11"  (1.803 m)   Wt 81.6 kg   SpO2 100%   BMI 25.10 kg/m   Physical Exam Vitals signs and nursing note reviewed.  Constitutional:      Appearance: He is well-developed.  HENT:     Head: Normocephalic and atraumatic.  Eyes:     Pupils: Pupils are equal, round, and reactive to light.  Neck:     Musculoskeletal: Normal range of motion and neck supple.     Vascular: No JVD.  Cardiovascular:     Rate and Rhythm: Normal rate and regular rhythm.     Heart sounds: No murmur. No friction rub. No gallop.   Pulmonary:     Effort: No  respiratory distress.     Breath sounds: No wheezing.  Abdominal:     General: There is no distension.     Tenderness: There is no guarding or rebound.  Musculoskeletal: Normal range of motion.        General: Tenderness present.     Comments: Deformity to the left thumb with it held in flexion.  Noted palpable base of the thumb lateral to the radial aspect of the hand.  He is able to add and abduct the finger, he feels he cannot move it in a circle.  Intact sensation to light touch bilaterally.  Skin:    Coloration: Skin is not pale.     Findings: No rash.  Neurological:     Mental Status: He is alert and oriented to person, place, and time.  Psychiatric:        Behavior: Behavior normal.      ED Treatments / Results  Labs (all labs ordered are listed, but only abnormal results are displayed) Labs Reviewed - No data to display  EKG None  Radiology Dg Hand Complete Left  Result Date: 07/24/2018 CLINICAL DATA:  Status post reduction of dislocated thumb. EXAM: LEFT HAND - COMPLETE 3+ VIEW COMPARISON:  Radiographs of same day. FINDINGS: There is no evidence of fracture or dislocation. Previously described dislocation of first carpometacarpal joint has been successfully reduced. There is no evidence of arthropathy or other focal bone abnormality. Soft tissues are unremarkable. IMPRESSION: Successful reduction of previously described first carpometacarpal dislocation. Electronically Signed   By: Lupita RaiderJames  Green Jr, M.D.   On: 07/24/2018 09:25   Dg Hand Complete Left  Result Date: 07/24/2018 CLINICAL DATA:  Left hand pain after fall. EXAM: LEFT HAND - COMPLETE 3+ VIEW COMPARISON:  None. FINDINGS: Radially dislocated first CMC joint.  No fracture. IMPRESSION: First CMC dislocation. Electronically Signed   By: Marnee SpringJonathon  Watts M.D.   On: 07/24/2018 07:40    Procedures Reduction of dislocation Date/Time: 07/24/2018 9:28 AM Performed by: Melene PlanFloyd, Kyoko Elsea, DO Authorized by: Melene PlanFloyd, Elli Groesbeck, DO    Consent: Verbal consent obtained. Risks and benefits: risks, benefits and alternatives were discussed Consent given by: patient Patient understanding: patient states understanding of the procedure being performed Patient consent: the patient's understanding of the procedure matches consent given Imaging studies: imaging studies available Patient identity confirmed: verbally with patient Time out: Immediately prior to procedure a "time out" was called to verify the correct patient, procedure, equipment, support staff and site/side marked as required. Local anesthesia used: no  Anesthesia: Local anesthesia used: no  Sedation: Patient sedated: no  Patient tolerance: Patient tolerated  the procedure well with no immediate complications    (including critical care time)  Medications Ordered in ED Medications  fentaNYL (SUBLIMAZE) injection 100 mcg (100 mcg Intravenous Given 07/24/18 0809)     Initial Impression / Assessment and Plan / ED Course  I have reviewed the triage vital signs and the nursing notes.  Pertinent labs & imaging results that were available during my care of the patient were reviewed by me and considered in my medical decision making (see chart for details).     38 yo M with a chief complaint of pain to the left thumb.  He is right-handed.  Fell in the shower couple days ago and having continued pain and swelling so came in for evaluation.  Plain film viewed by me with dislocated CMC joint.  Will discuss with hand.  Easily reduced at bedside.  Confirmed on plain film.  Discussed with orthopedics, will follow-up in the office.  9:30 AM:  I have discussed the diagnosis/risks/treatment options with the patient and believe the pt to be eligible for discharge home to follow-up with Hand surgery. We also discussed returning to the ED immediately if new or worsening sx occur. We discussed the sx which are most concerning (e.g., sudden worsening pain,numbness) that  necessitate immediate return. Medications administered to the patient during their visit and any new prescriptions provided to the patient are listed below.  Medications given during this visit Medications  fentaNYL (SUBLIMAZE) injection 100 mcg (100 mcg Intravenous Given 07/24/18 0809)     The patient appears reasonably screen and/or stabilized for discharge and I doubt any other medical condition or other [facility] requiring further screening, evaluation, or treatment in the ED at this time prior to discharge.    Final Clinical Impressions(s) / ED Diagnoses   Final diagnoses:  CMC (carpometacarpal joint) dislocation, left, initial encounter    ED Discharge Orders    None       Melene Plan, DO 07/24/18 0930

## 2018-07-24 NOTE — Discharge Instructions (Addendum)
The orthopedic office should give you a call to schedule an appointment.  Return for worsening pain or numbness.  He can take Tylenol and ibuprofen for the pain.

## 2018-07-24 NOTE — ED Triage Notes (Signed)
Patient complaining of left hand injury. Patient states that he was in the shower and slipped. When he slipped he try to catch his fall hurting his hand. Hand is swollen.

## 2019-04-23 ENCOUNTER — Other Ambulatory Visit: Payer: Self-pay

## 2019-04-23 DIAGNOSIS — Z20822 Contact with and (suspected) exposure to covid-19: Secondary | ICD-10-CM

## 2019-04-25 LAB — NOVEL CORONAVIRUS, NAA: SARS-CoV-2, NAA: NOT DETECTED

## 2019-07-09 ENCOUNTER — Ambulatory Visit: Payer: BC Managed Care – PPO | Attending: Internal Medicine

## 2019-07-09 DIAGNOSIS — Z20822 Contact with and (suspected) exposure to covid-19: Secondary | ICD-10-CM

## 2019-07-10 LAB — NOVEL CORONAVIRUS, NAA: SARS-CoV-2, NAA: NOT DETECTED
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
## 2019-10-04 ENCOUNTER — Emergency Department (HOSPITAL_COMMUNITY)
Admission: EM | Admit: 2019-10-04 | Discharge: 2019-10-04 | Disposition: A | Discharge status: Home / Self Care | Payer: BC Managed Care – PPO | Attending: Emergency Medicine | Admitting: Emergency Medicine

## 2019-10-04 ENCOUNTER — Other Ambulatory Visit: Payer: Self-pay

## 2019-10-04 ENCOUNTER — Encounter (HOSPITAL_COMMUNITY): Payer: Self-pay | Admitting: Emergency Medicine

## 2019-10-04 DIAGNOSIS — Y999 Unspecified external cause status: Secondary | ICD-10-CM | POA: Diagnosis not present

## 2019-10-04 DIAGNOSIS — Y929 Unspecified place or not applicable: Secondary | ICD-10-CM | POA: Diagnosis not present

## 2019-10-04 DIAGNOSIS — Z23 Encounter for immunization: Secondary | ICD-10-CM | POA: Insufficient documentation

## 2019-10-04 DIAGNOSIS — Y939 Activity, unspecified: Secondary | ICD-10-CM | POA: Insufficient documentation

## 2019-10-04 DIAGNOSIS — Z720 Tobacco use: Secondary | ICD-10-CM | POA: Insufficient documentation

## 2019-10-04 DIAGNOSIS — S61313A Laceration without foreign body of left middle finger with damage to nail, initial encounter: Secondary | ICD-10-CM

## 2019-10-04 DIAGNOSIS — W208XXA Other cause of strike by thrown, projected or falling object, initial encounter: Secondary | ICD-10-CM | POA: Insufficient documentation

## 2019-10-04 MED ORDER — LIDOCAINE HCL 2 % IJ SOLN
5.0000 mL | Freq: Once | INTRAMUSCULAR | Status: AC
  Administered 2019-10-04: 100 mg via INTRADERMAL
  Filled 2019-10-04: qty 20

## 2019-10-04 MED ORDER — TETANUS-DIPHTH-ACELL PERTUSSIS 5-2.5-18.5 LF-MCG/0.5 IM SUSP
0.5000 mL | Freq: Once | INTRAMUSCULAR | Status: AC
  Administered 2019-10-04: 16:00:00 0.5 mL via INTRAMUSCULAR
  Filled 2019-10-04: qty 0.5

## 2019-10-04 NOTE — ED Provider Notes (Signed)
[place] EMERGENCY DEPARTMENT Provider Note   CSN: [id number] Arrival date & time: 10/04/19  1347     History Chief Complaint  Patient presents with  . Extremity Laceration    Dominic Lowery is a 39 y.o. male who presents with a a nail laceration. Pt was lifting a metal table and a drawer on the table closed on to his left middle finger. He had an immediate onset of pain and the nail became embedded in to the nail bed. Pain is currently 4/10. He is not UTD on tetanus. Movement makes it worse. Being still makes it better.  HPI     History reviewed. No pertinent past medical history.  There are no problems to display for this patient.   History reviewed. No pertinent surgical history.     Family History  Problem Relation Age of Onset  . Anemia Mother     Social History   Tobacco Use  . Smoking status: Current Some Day Smoker    Types: Cigars  . Smokeless tobacco: Never Used  Substance Use Topics  . Alcohol use: Yes    Comment: occasionally  . Drug use: Not Currently    Types: Marijuana    Home Medications Prior to Admission medications   Medication Sig Start Date End Date Taking? Authorizing Provider  acetaminophen (TYLENOL) 325 MG tablet Take 650 mg by mouth every 6 (six) hours as needed for mild pain or headache.    [provider]  naproxen (NAPROSYN) 500 MG tablet Take 1 tablet (500 mg total) by mouth 2 (two) times daily. 07/13/18   Ashley Murrain, NP  Pheniramine-PE-APAP [facility] COLD & SORE THROAT) 20-10-325 MG PACK Take 1 Package by mouth 3 times/day as needed-between meals & bedtime (for cough and pain).    [provider]    Allergies    Patient has no known allergies.  Review of Systems   Review of Systems  Musculoskeletal: Positive for arthralgias.  Skin: Positive for wound.  Neurological: Negative for weakness and numbness.    Physical Exam Updated Vital Signs BP 138/63   Pulse 98   Temp 98.7 F  (37.1 C) (Oral)   Resp 16   Ht 6\' 1"  (1.854 m)   Wt 81.6 kg   SpO2 98%   BMI 23.75 kg/m   Physical Exam Vitals and nursing note reviewed.  Constitutional:      General: He is not in acute distress.    Appearance: Normal appearance. He is well-developed. He is not ill-appearing.  HENT:     Head: Normocephalic and atraumatic.  Eyes:     General: No scleral icterus.       Right eye: No discharge.        Left eye: No discharge.     Conjunctiva/sclera: Conjunctivae normal.     Pupils: Pupils are equal, round, and reactive to light.  Cardiovascular:     Rate and Rhythm: Normal rate.  Pulmonary:     Effort: Pulmonary effort is normal. No respiratory distress.  Abdominal:     General: There is no distension.  Musculoskeletal:     Cervical back: Normal range of motion.     Comments: Left middle finger: Horizontal laceration over the middle of the nail over the medial aspect. Nail is curved inward in to the nail bed. Bleeding is controlled. Normal ROM of the finger. N/V intact.   Skin:    General: Skin is warm and dry.  Neurological:  Mental Status: He is alert and oriented to person, place, and time.  Psychiatric:        Behavior: Behavior normal.     ED Results / Procedures / Treatments   Labs (all labs ordered are listed, but only abnormal results are displayed) Labs Reviewed - No data to display  EKG None  Radiology No results found.  Procedures .Nerve Block  Date/Time: 10/04/2019 4:25 PM Performed by: Bethel Born, PA-C Authorized by: Bethel Born, PA-C   Consent:    Consent obtained:  Verbal   Consent given by:  Patient   Risks discussed:  Infection, unsuccessful block, pain and bleeding   Alternatives discussed:  No treatment Indications:    Indications:  Pain relief and procedural anesthesia Location:    Nerve block body site: Left middle finger.   Laterality:  Left Pre-procedure details:    Skin preparation:  Alcohol   Preparation:  Patient was prepped and draped in usual sterile fashion   Skin anesthesia (see MAR for exact dosages):    Skin anesthesia method:  None Procedure details (see MAR for exact dosages):    Block needle gauge:  27 G   Anesthetic injected:  Lidocaine 2% w/o epi   Steroid injected:  None   Additive injected:  None   Injection procedure:  Anatomic landmarks identified, anatomic landmarks palpated, incremental injection and introduced needle   Paresthesia:  None Post-procedure details:    Dressing:  None   Outcome:  Anesthesia achieved   Patient tolerance of procedure:  Tolerated well, no immediate complications   (including critical care time)    Medications Ordered in ED Medications  Tdap (BOOSTRIX) injection 0.5 mL (0.5 mLs Intramuscular Given 10/04/19 1549)  lidocaine (XYLOCAINE) 2 % (with pres) injection 100 mg (100 mg Intradermal Given 10/04/19 1551)    ED Course  I have reviewed the triage vital signs and the nursing notes.  Pertinent labs & imaging results that were available during my care of the patient were reviewed by me and considered in my medical decision making (see chart for details).  39 year old male presents with a left middle finger nail laceration after it was slammed in a door.  His vital signs are normal.  The nail is cut midway through and embedded into the nailbed.  A digital block was performed and anesthesia was achieved.  The nail was lifted out of the nailbed and approximated with the rest of the nail.  Do not feel like the nail needs to be removed.  Patient instructed on local wound care and advised to watch for signs of infection.  MDM Rules/Calculators/A&P                       Final Clinical Impression(s) / ED Diagnoses Final diagnoses:  Laceration of left middle finger without foreign body with damage to nail, initial encounter    Rx / DC Orders ED Discharge Orders    None       Bethel Born, PA-C 10/04/19 1628    Margarita Grizzle,  MD 10/05/19 [phone]

## 2019-10-04 NOTE — Discharge Instructions (Signed)
Keep a dressing or bandage on the finger for the next 2-3 days Keep clean with soap and water Return if you have any signs of infection (redness, drainage, worsening pain)

## 2019-10-04 NOTE — ED Triage Notes (Signed)
Pt has cut to left middle finger from metal.

## 2019-11-12 ENCOUNTER — Other Ambulatory Visit: Payer: Self-pay

## 2019-11-12 ENCOUNTER — Encounter (HOSPITAL_COMMUNITY): Payer: Self-pay | Admitting: Emergency Medicine

## 2019-11-12 ENCOUNTER — Emergency Department (HOSPITAL_COMMUNITY)
Admission: EM | Admit: 2019-11-12 | Discharge: 2019-11-12 | Disposition: A | Discharge status: Home / Self Care | Payer: BC Managed Care – PPO | Attending: Emergency Medicine | Admitting: Emergency Medicine

## 2019-11-12 DIAGNOSIS — Z09 Encounter for follow-up examination after completed treatment for conditions other than malignant neoplasm: Secondary | ICD-10-CM | POA: Diagnosis not present

## 2019-11-12 DIAGNOSIS — Z5189 Encounter for other specified aftercare: Secondary | ICD-10-CM

## 2019-11-12 NOTE — ED Provider Notes (Signed)
[place] EMERGENCY DEPARTMENT Provider Note   CSN: [id number] Arrival date & time: 11/12/19  6213     History Chief Complaint  Patient presents with  . Wound Check    Dominic Lowery is a 39 y.o. male.  Dominic Lowery is a 39 y.o. male who presents to the ED for wound check.  Patient was seen in the emergency department on 4/29 after he had a crush injury to the left ring finger where a portion of the nail became embedded in the nailbed.  The area was anesthetized and the embedded nail was pulled out, but the entire nail was still intact within the nail fold, nail was not removed.  No other repair needed, patient was given wound care instructions and discharge.  He returns today just requesting a checkup on how this is healing.  He states he only has pain if he accidentally hits the fingertip on something he denies any redness, swelling, warmth or drainage.  Is able to bend and extend the finger without difficulty.  He reports that the nail has been growing, but the portion that was injured has not grown out all the way yet.  No other aggravating or alleviating factors.        History reviewed. No pertinent past medical history.  There are no problems to display for this patient.   History reviewed. No pertinent surgical history.     Family History  Problem Relation Age of Onset  . Anemia Mother     Social History   Tobacco Use  . Smoking status: Current Some Day Smoker    Types: Cigars  . Smokeless tobacco: Never Used  Substance Use Topics  . Alcohol use: Yes    Comment: occasionally  . Drug use: Not Currently    Types: Marijuana    Home Medications Prior to Admission medications   Medication Sig Start Date End Date Taking? Authorizing Provider  acetaminophen (TYLENOL) 325 MG tablet Take 650 mg by mouth every 6 (six) hours as needed for mild pain or headache.    [provider]  naproxen (NAPROSYN) 500 MG tablet Take 1 tablet (500  mg total) by mouth 2 (two) times daily. 07/13/18   Ashley Murrain, NP  Pheniramine-PE-APAP [facility] COLD & SORE THROAT) 20-10-325 MG PACK Take 1 Package by mouth 3 times/day as needed-between meals & bedtime (for cough and pain).    [provider]    Allergies    Patient has no known allergies.  Review of Systems   Review of Systems  Constitutional: Negative for chills and fever.  Skin: Positive for wound.  Neurological: Negative for weakness and numbness.    Physical Exam Updated Vital Signs BP 125/77 (BP Location: Right Arm)   Pulse 94   Temp 98.1 F (36.7 C) (Oral)   Resp 16   Ht 6' (1.829 m)   Wt 74.8 kg   SpO2 98%   BMI 22.38 kg/m   Physical Exam Vitals and nursing note reviewed.  Constitutional:      General: He is not in acute distress.    Appearance: He is well-developed. He is not diaphoretic.  HENT:     Head: Normocephalic and atraumatic.  Eyes:     General:        Right eye: No discharge.        Left eye: No discharge.  Pulmonary:     Effort: Pulmonary effort is normal. No respiratory distress.  Musculoskeletal:  Comments: Left ring finger with crush injury well-healed, nail is intact, and appears to be growing out appropriately, see photo below, no erythema, swelling or tenderness to palpation, full range of motion of the ring finger, normal sensation.  Skin:    General: Skin is warm and dry.  Neurological:     Mental Status: He is alert.     Coordination: Coordination normal.  Psychiatric:        Behavior: Behavior normal.       ED Results / Procedures / Treatments   Labs (all labs ordered are listed, but only abnormal results are displayed) Labs Reviewed - No data to display  EKG None  Radiology No results found.  Procedures Procedures (including critical care time)  Medications Ordered in ED Medications - No data to display  ED Course  I have reviewed the triage vital signs and the nursing notes.  Pertinent labs &  imaging results that were available during my care of the patient were reviewed by me and considered in my medical decision making (see chart for details).    MDM Rules/Calculators/A&P                      39 year old male presents for wound check after nailbed injury over 1 month ago, area appears to be healing well, no signs of infection or abscess, he has full range of motion and normal sensation of the finger.  Provided him with reassurance that it will take time for nail to grow out, but that it looks to have healed well. Pt expresses understanding and agreement, discharged home in good condition.  Final Clinical Impression(s) / ED Diagnoses Final diagnoses:  Visit for wound check    Rx / DC Orders ED Discharge Orders    None       Dartha Lodge, [place] 11/12/19 6010    Geoffery Lyons, MD 11/12/19 [phone]

## 2019-11-12 NOTE — ED Notes (Signed)
Pt discharged from ED in NAD, verbalizes understanding of the discharge instructions

## 2019-11-12 NOTE — Discharge Instructions (Signed)
Wound appears to be healing well, it will take time for nail to grow out.

## 2019-11-12 NOTE — ED Triage Notes (Signed)
Pt. Stated, Im here to recheck left ring finger 2 montrhs ago.

## 2019-12-30 ENCOUNTER — Other Ambulatory Visit: Payer: Self-pay

## 2019-12-30 ENCOUNTER — Encounter (HOSPITAL_COMMUNITY): Payer: Self-pay | Admitting: Emergency Medicine

## 2019-12-30 ENCOUNTER — Emergency Department (HOSPITAL_COMMUNITY)
Admission: EM | Admit: 2019-12-30 | Discharge: 2019-12-30 | Disposition: A | Discharge status: Home / Self Care | Payer: BC Managed Care – PPO | Attending: Pediatric Emergency Medicine | Admitting: Pediatric Emergency Medicine

## 2019-12-30 DIAGNOSIS — F17290 Nicotine dependence, other tobacco product, uncomplicated: Secondary | ICD-10-CM | POA: Insufficient documentation

## 2019-12-30 DIAGNOSIS — Z20822 Contact with and (suspected) exposure to covid-19: Secondary | ICD-10-CM | POA: Diagnosis not present

## 2019-12-30 DIAGNOSIS — R05 Cough: Secondary | ICD-10-CM | POA: Diagnosis present

## 2019-12-30 LAB — SARS CORONAVIRUS 2 BY RT PCR (HOSPITAL ORDER, PERFORMED IN ~~LOC~~ HOSPITAL LAB): SARS Coronavirus 2: NEGATIVE

## 2019-12-30 NOTE — ED Provider Notes (Signed)
[facility] EMERGENCY DEPARTMENT Provider Note   CSN: [id number] Arrival date & time: 12/30/19  1258     History Chief Complaint  Patient presents with  . COVID exposure    Dominic Lowery is a 39 y.o. male healthy with COVID exposure at home.  No symptoms.     Cough Cough characteristics:  Non-productive Sputum characteristics:  Unable to specify Severity:  Mild Onset quality:  Unable to specify Timing:  Intermittent Progression:  Unchanged Chronicity:  Chronic Smoker: no   Context: sick contacts   Relieved by:  None tried Worsened by:  Nothing Ineffective treatments:  None tried Associated symptoms: no chest pain, no chills, no ear pain, no fever, no rash, no shortness of breath and no sore throat        History reviewed. No pertinent past medical history.  There are no problems to display for this patient.   History reviewed. No pertinent surgical history.     Family History  Problem Relation Age of Onset  . Anemia Mother     Social History   Tobacco Use  . Smoking status: Current Some Day Smoker    Types: Cigars  . Smokeless tobacco: Never Used  Vaping Use  . Vaping Use: Never used  Substance Use Topics  . Alcohol use: Yes    Comment: occasionally  . Drug use: Not Currently    Types: Marijuana    Home Medications Prior to Admission medications   Medication Sig Start Date End Date Taking? Authorizing Provider  acetaminophen (TYLENOL) 325 MG tablet Take 650 mg by mouth every 6 (six) hours as needed for mild pain or headache.    [provider]  naproxen (NAPROSYN) 500 MG tablet Take 1 tablet (500 mg total) by mouth 2 (two) times daily. 07/13/18   Janne Napoleon, NP  Pheniramine-PE-APAP [facility] COLD & SORE THROAT) 20-10-325 MG PACK Take 1 Package by mouth 3 times/day as needed-between meals & bedtime (for cough and pain).    [provider]    Allergies    Patient has no known allergies.  Review of Systems    Review of Systems  Constitutional: Negative for chills and fever.  HENT: Negative for ear pain and sore throat.   Eyes: Negative for pain and visual disturbance.  Respiratory: Positive for cough. Negative for shortness of breath.   Cardiovascular: Negative for chest pain and palpitations.  Gastrointestinal: Negative for abdominal pain and vomiting.  Genitourinary: Negative for dysuria and hematuria.  Musculoskeletal: Negative for arthralgias and back pain.  Skin: Negative for color change and rash.  Neurological: Negative for seizures and syncope.  All other systems reviewed and are negative.   Physical Exam Updated Vital Signs BP (!) 148/90 (BP Location: Left Arm)   Pulse 85   Temp 98.3 F (36.8 C) (Oral)   Resp 21   Ht 6' (1.829 m)   Wt 77.1 kg   SpO2 100%   BMI 23.06 kg/m   Physical Exam Vitals and nursing note reviewed.  Constitutional:      Appearance: He is well-developed.  HENT:     Head: Normocephalic and atraumatic.     Nose: No congestion or rhinorrhea.  Eyes:     Conjunctiva/sclera: Conjunctivae normal.  Cardiovascular:     Rate and Rhythm: Normal rate and regular rhythm.     Heart sounds: No murmur heard.   Pulmonary:     Effort: Pulmonary effort is normal. No respiratory distress.  Abdominal:  Palpations: Abdomen is soft.     Tenderness: There is no abdominal tenderness.  Musculoskeletal:     Cervical back: Neck supple.  Skin:    General: Skin is warm and dry.     Capillary Refill: Capillary refill takes less than 2 seconds.  Neurological:     General: No focal deficit present.     Mental Status: He is alert.     ED Results / Procedures / Treatments   Labs (all labs ordered are listed, but only abnormal results are displayed) Labs Reviewed  SARS CORONAVIRUS 2 BY RT PCR (HOSPITAL ORDER, PERFORMED IN [facility] LAB)    EKG None  Radiology No results found.  Procedures Procedures (including critical care  time)  Medications Ordered in ED Medications - No data to display  ED Course  I have reviewed the triage vital signs and the nursing notes.  Pertinent labs & imaging results that were available during my care of the patient were reviewed by me and considered in my medical decision making (see chart for details).    MDM Rules/Calculators/A&P                          Dominic Lowery was evaluated in Emergency Department on 12/30/2019 for the symptoms described in the history of present illness. He was evaluated in the context of the global COVID-19 pandemic, which necessitated consideration that the patient might be at risk for infection with the SARS-CoV-2 virus that causes COVID-19. Institutional protocols and algorithms that pertain to the evaluation of patients at risk for COVID-19 are in a state of rapid change based on information released by regulatory bodies including the CDC and federal and state organizations. These policies and algorithms were followed during the patient's care in the ED.  Patient is overall well appearing with COVID exposure.  Exam notable for well appearing.  Normal saturations on room air.  Normal exam as above.  I have considered the following complications of COVID exposure: cough, pneumonia, effusion, bacteremia, and other serious bacterial illnesses.  Patient's presentation is not consistent with any of these complications.  COVID test pending.  Return precautions discussed with family prior to discharge and they were advised to follow with pcp as needed if symptoms worsen or fail to improve.   Final Clinical Impression(s) / ED Diagnoses Final diagnoses:  Close exposure to COVID-19 virus    Rx / DC Orders ED Discharge Orders    None       Kenyette Gundy, Wyvonnia Dusky, MD 12/30/19 1337

## 2019-12-30 NOTE — ED Triage Notes (Signed)
Wife tested + for COVID yesterday.  Pt has no symptoms.  Requested to be tested.

## 2019-12-30 NOTE — ED Notes (Addendum)
Discharge given from doorway to minimize contact and conserve PPE. Pt expressed understanding of discharge and denies any further questions or needs at this time.  

## 2020-03-17 ENCOUNTER — Other Ambulatory Visit: Payer: Self-pay

## 2020-03-17 ENCOUNTER — Encounter (HOSPITAL_COMMUNITY): Payer: Self-pay | Admitting: Emergency Medicine

## 2020-03-17 ENCOUNTER — Emergency Department (HOSPITAL_COMMUNITY)
Admission: EM | Admit: 2020-03-17 | Discharge: 2020-03-17 | Disposition: A | Discharge status: Home / Self Care | Payer: BC Managed Care – PPO | Attending: Emergency Medicine | Admitting: Emergency Medicine

## 2020-03-17 DIAGNOSIS — F17290 Nicotine dependence, other tobacco product, uncomplicated: Secondary | ICD-10-CM | POA: Insufficient documentation

## 2020-03-17 DIAGNOSIS — M25512 Pain in left shoulder: Secondary | ICD-10-CM | POA: Insufficient documentation

## 2020-03-17 MED ORDER — NAPROXEN 500 MG PO TABS
500.0000 mg | ORAL_TABLET | Freq: Two times a day (BID) | ORAL | 0 refills | Status: DC
Start: 2020-03-17 — End: 2020-07-13

## 2020-03-17 NOTE — ED Provider Notes (Signed)
[facility] EMERGENCY DEPARTMENT Provider Note   CSN: [id number] Arrival date & time: 03/17/20  2703     History Chief Complaint  Patient presents with  . Shoulder Pain    Dominic Lowery is a 39 y.o. male.  Patient is a 39 year old male with no significant past medical history.  He presents with a 2-week history of pain to the left shoulder.  This pain comes and goes, but not related to exertion.  He denies any shortness of breath, cough, fever, or chills.  He does describe occasional numbness to his left hand, especially his index and middle finger.  He denies any weakness.  He denies any neck pain.  Patient performs repetitive movements at work.  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  The history is provided by the patient.  Shoulder Pain Location:  Shoulder Shoulder location:  L shoulder Injury: no   Pain details:    Quality:  Aching   Radiates to:  L fingers   Severity:  Moderate   Onset quality:  Gradual   Duration:  2 weeks   Timing:  Constant   Progression:  Worsening Relieved by:  Nothing Worsened by:  Nothing Ineffective treatments:  None tried      History reviewed. No pertinent past medical history.  There are no problems to display for this patient.   No past surgical history on file.     Family History  Problem Relation Age of Onset  . Anemia Mother     Social History   Tobacco Use  . Smoking status: Current Some Day Smoker    Types: Cigars  . Smokeless tobacco: Never Used  Vaping Use  . Vaping Use: Never used  Substance Use Topics  . Alcohol use: Yes    Comment: occasionally  . Drug use: Not Currently    Types: Marijuana    Home Medications Prior to Admission medications   Medication Sig Start Date End Date Taking? Authorizing Provider  acetaminophen (TYLENOL) 325 MG tablet Take 650 mg by mouth every 6 (six) hours as needed for mild pain or headache.    [provider]  naproxen  (NAPROSYN) 500 MG tablet Take 1 tablet (500 mg total) by mouth 2 (two) times daily. 07/13/18   Janne Napoleon, NP  Pheniramine-PE-APAP [facility] COLD & SORE THROAT) 20-10-325 MG PACK Take 1 Package by mouth 3 times/day as needed-between meals & bedtime (for cough and pain).    [provider]    Allergies    Patient has no known allergies.  Review of Systems   Review of Systems  All other systems reviewed and are negative.   Physical Exam Updated Vital Signs Pulse 80   Temp 97.8 F (36.6 C) (Oral)   Resp 16   Ht 6' (1.829 m)   Wt 77 kg   SpO2 100%   BMI 23.02 kg/m   Physical Exam Vitals and nursing note reviewed.  Constitutional:      General: He is not in acute distress.    Appearance: Normal appearance. He is not ill-appearing.  HENT:     Head: Normocephalic and atraumatic.  Cardiovascular:     Rate and Rhythm: Normal rate and regular rhythm.     Heart sounds: No murmur heard.   Pulmonary:     Effort: Pulmonary effort is normal.  Musculoskeletal:     Cervical back: Normal range of motion and neck supple. No rigidity or tenderness.  Comments: There is mild tenderness to palpation to the anterior deltoid.  He has full range of motion of the shoulder with no crepitus.  Ulnar and radial pulses are easily palpable and motor and sensation are intact throughout the entire hand.  He is able to flex and extend all fingers.  Lymphadenopathy:     Cervical: No cervical adenopathy.  Skin:    General: Skin is warm and dry.  Neurological:     Mental Status: He is alert and oriented to person, place, and time.     ED Results / Procedures / Treatments   Labs (all labs ordered are listed, but only abnormal results are displayed) Labs Reviewed - No data to display  EKG None  Radiology No results found.  Procedures Procedures (including critical care time)  Medications Ordered in ED Medications - No data to display  ED Course  I have reviewed the triage vital  signs and the nursing notes.  Pertinent labs & imaging results that were available during my care of the patient were reviewed by me and considered in my medical decision making (see chart for details).    MDM Rules/Calculators/A&P  Patient with what appears to be radicular left shoulder pain.  He has no deficits in strength.  I feel as though a trial course of anti-inflammatories are indicated and follow-up with PCP if not improving.  Final Clinical Impression(s) / ED Diagnoses Final diagnoses:  None    Rx / DC Orders ED Discharge Orders    None       Geoffery Lyons, MD 03/17/20 1414

## 2020-03-17 NOTE — ED Triage Notes (Signed)
Pt states for 2-3 weeks he has had a mild ache in this left shoulder and back side of shoulder that has been constant, this pain also going into this arma dn often witll have a numb feeling in his index and middle finger. 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 Pt has no chest pain or sob. No known history.

## 2020-03-17 NOTE — Discharge Instructions (Signed)
Rest.  Begin taking naproxen as prescribed.  Follow-up with primary doctor if symptoms or not improving in the next 1 to 2 weeks.

## 2020-06-18 ENCOUNTER — Other Ambulatory Visit: Payer: Self-pay

## 2020-06-18 ENCOUNTER — Emergency Department (HOSPITAL_COMMUNITY)
Admission: EM | Admit: 2020-06-18 | Discharge: 2020-06-18 | Disposition: A | Discharge status: Home / Self Care | Payer: BC Managed Care – PPO | Attending: Emergency Medicine | Admitting: Emergency Medicine

## 2020-06-18 DIAGNOSIS — M2540 Effusion, unspecified joint: Secondary | ICD-10-CM | POA: Diagnosis not present

## 2020-06-18 DIAGNOSIS — F17290 Nicotine dependence, other tobacco product, uncomplicated: Secondary | ICD-10-CM | POA: Insufficient documentation

## 2020-06-18 DIAGNOSIS — R109 Unspecified abdominal pain: Secondary | ICD-10-CM

## 2020-06-18 DIAGNOSIS — T23112S Burn of first degree of left thumb (nail), sequela: Secondary | ICD-10-CM

## 2020-06-18 MED ORDER — SODIUM CHLORIDE 0.9 % IV BOLUS: 1000.0000 mL | Freq: Once | INTRAVENOUS | Status: DC

## 2020-06-18 NOTE — Discharge Instructions (Signed)
You may have a small hernia in your abdomen.  You can try and follow-up with the wellness clinic for primary care follow-up or the surgical clinic to arrange for outpatient CT to further evaluate.   For your left hand burn, please avoid hydrocortisone cream as it will delay healing.  You may use Vaseline as needed  See your doctor for follow-up.  Return to the ER if you have severe abdominal pain, vomiting, fevers
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
## 2020-06-18 NOTE — ED Triage Notes (Signed)
Pt arrived via walk in, states he has some intermittent pain in his stomach he was told is a hernia, was told to follow up with PCP, does not have PCP so states he came back here for follow up. No worsening pain or issues today. Also states he has area on left hand that is irritated he believes might be from a cleaning chemical that he uses at work but cannot remember doing anything to it.

## 2020-07-13 ENCOUNTER — Emergency Department (HOSPITAL_COMMUNITY): Payer: BC Managed Care – PPO

## 2020-07-13 ENCOUNTER — Encounter (HOSPITAL_COMMUNITY): Payer: Self-pay | Admitting: Emergency Medicine

## 2020-07-13 ENCOUNTER — Emergency Department (HOSPITAL_COMMUNITY)
Admission: EM | Admit: 2020-07-13 | Discharge: 2020-07-13 | Disposition: A | Discharge status: Home / Self Care | Payer: BC Managed Care – PPO | Attending: Emergency Medicine | Admitting: Emergency Medicine

## 2020-07-13 ENCOUNTER — Other Ambulatory Visit: Payer: Self-pay

## 2020-07-13 DIAGNOSIS — Y990 Civilian activity done for income or pay: Secondary | ICD-10-CM | POA: Diagnosis not present

## 2020-07-13 DIAGNOSIS — X500XXA Overexertion from strenuous movement or load, initial encounter: Secondary | ICD-10-CM | POA: Insufficient documentation

## 2020-07-13 DIAGNOSIS — F17210 Nicotine dependence, cigarettes, uncomplicated: Secondary | ICD-10-CM | POA: Diagnosis not present

## 2020-07-13 DIAGNOSIS — R0789 Other chest pain: Secondary | ICD-10-CM | POA: Diagnosis not present

## 2020-07-13 DIAGNOSIS — R079 Chest pain, unspecified: Secondary | ICD-10-CM | POA: Diagnosis present

## 2020-07-13 LAB — TROPONIN I (HIGH SENSITIVITY)
Troponin I (High Sensitivity): 8 ng/L (ref <18)
Troponin I (High Sensitivity): 7 ng/L (ref <18)

## 2020-07-13 LAB — CBC
HCT: 40.5 % (ref 39.0–52.0)
Hemoglobin: 12.9 g/dL — ABNORMAL LOW (ref 13.0–17.0)
MCH: 28.3 pg (ref 26.0–34.0)
MCHC: 31.9 g/dL (ref 30.0–36.0)
MCV: 88.8 fL (ref 80.0–100.0)
Platelets: 270 10*3/uL (ref 150–400)
RBC: 4.56 MIL/uL (ref 4.22–5.81)
RDW: 12.4 % (ref 11.5–15.5)
WBC: 6.4 10*3/uL (ref 4.0–10.5)
nRBC: 0 % (ref 0.0–0.2)

## 2020-07-13 LAB — BASIC METABOLIC PANEL
Anion gap: 9 (ref 5–15)
BUN: 17 mg/dL (ref 6–20)
CO2: 23 mmol/L (ref 22–32)
Calcium: 9.3 mg/dL (ref 8.9–10.3)
Chloride: 106 mmol/L (ref 98–111)
Creatinine, Ser: 0.85 mg/dL (ref 0.61–1.24)
GFR, Estimated: >60 mL/min (ref >60)
Glucose, Bld: 131 mg/dL — ABNORMAL HIGH (ref 70–99)
Potassium: 3.8 mmol/L (ref 3.5–5.1)
Sodium: 138 mmol/L (ref 135–145)

## 2020-07-13 MED ORDER — IBUPROFEN 800 MG PO TABS
800.0000 mg | ORAL_TABLET | Freq: Three times a day (TID) | ORAL | 0 refills | Status: DC | PRN
Start: 2020-07-13 — End: 2022-05-17

## 2020-07-13 NOTE — Discharge Instructions (Addendum)
Return if any problems.

## 2020-07-13 NOTE — ED Provider Notes (Signed)
[facility] EMERGENCY DEPARTMENT Provider Note   CSN: [id number] Arrival date & time: 07/13/20  2706     History Chief Complaint  Patient presents with  . Chest Pain    Dominic Lowery is a 40 y.o. male.  Pt reports he has pain in the left side of his chest.  Pt reports pain is worse with lifting.  Pt reports lifts a lot at work.  Pt reports he has an area that is sore.  Pt reports he thinks it is a muscle   The history is provided by the patient. No language interpreter was used.  Chest Pain Pain location:  L chest Pain quality: aching   Pain radiates to:  Does not radiate Pain severity:  Moderate Onset quality:  Gradual Duration:  3 weeks Timing:  Constant Chronicity:  New Context: lifting   Relieved by:  Nothing Worsened by:  Nothing Ineffective treatments:  None tried Risk factors: male sex        History reviewed. No pertinent past medical history.  There are no problems to display for this patient.   History reviewed. No pertinent surgical history.     Family History  Problem Relation Age of Onset  . Anemia Mother     Social History   Tobacco Use  . Smoking status: Current Some Day Smoker    Types: Cigars  . Smokeless tobacco: Never Used  Vaping Use  . Vaping Use: Never used  Substance Use Topics  . Alcohol use: Yes    Comment: occasionally  . Drug use: Not Currently    Types: Marijuana    Home Medications Prior to Admission medications   Medication Sig Start Date End Date Taking? Authorizing Provider  ibuprofen (ADVIL) 200 MG tablet Take 200 mg by mouth every 6 (six) hours as needed for headache.   Yes [provider]  naproxen (NAPROSYN) 500 MG tablet Take 1 tablet (500 mg total) by mouth 2 (two) times daily. Patient not taking: Reported on 07/13/2020 03/17/20   Geoffery Lyons, MD  Pheniramine-PE-APAP 20-10-325 MG PACK Take 1 Package by mouth 3 times/day as needed-between meals & bedtime (for cough and  pain). Patient not taking: Reported on 07/13/2020    [provider]    Allergies    Patient has no known allergies.  Review of Systems   Review of Systems  Cardiovascular: Positive for chest pain.  All other systems reviewed and are negative.   Physical Exam Updated Vital Signs BP (!) 138/93 (BP Location: Right Arm)   Pulse 93   Temp 98.5 F (36.9 C) (Oral)   Resp (!) 21   SpO2 100%   Physical Exam Vitals and nursing note reviewed.  Constitutional:      Appearance: He is well-developed and well-nourished.  HENT:     Head: Normocephalic and atraumatic.  Eyes:     Conjunctiva/sclera: Conjunctivae normal.  Cardiovascular:     Rate and Rhythm: Normal rate and regular rhythm.     Heart sounds: Normal heart sounds. No murmur heard.   Pulmonary:     Effort: Pulmonary effort is normal. No respiratory distress.     Breath sounds: Normal breath sounds.  Chest:     Chest wall: Tenderness present.     Comments: Tender left chest to palpation  Abdominal:     Palpations: Abdomen is soft.     Tenderness: There is no abdominal tenderness.  Musculoskeletal:        General: No edema.  Cervical back: Neck supple.  Skin:    General: Skin is warm and dry.  Neurological:     General: No focal deficit present.     Mental Status: He is alert.  Psychiatric:        Mood and Affect: Mood and affect and mood normal.     ED Results / Procedures / Treatments   Labs (all labs ordered are listed, but only abnormal results are displayed) Labs Reviewed  BASIC METABOLIC PANEL - Abnormal; Notable for the following components:      Result Value   Glucose, Bld 131 (*)    All other components within normal limits  CBC - Abnormal; Notable for the following components:   Hemoglobin 12.9 (*)    All other components within normal limits  TROPONIN I (HIGH SENSITIVITY)  TROPONIN I (HIGH SENSITIVITY)    EKG EKG Interpretation  Date/Time:  Sunday July 13 2020 07:20:15  EST Ventricular Rate:  93 PR Interval:  150 QRS Duration: 88 QT Interval:  330 QTC Calculation: 410 R Axis:   67 Text Interpretation: Normal sinus rhythm Normal ECG No significant change since last tracing Confirmed by Plunkett, Whitney ([id number]) on 07/13/2020 12:25:22 PM   Radiology DG Chest 2 View  Result Date: 07/13/2020 CLINICAL DATA:  39 year old male with intermittent left chest pain for 1 week. No known injury. EXAM: CHEST - 2 VIEW COMPARISON:  Chest radiographs 07/21/2015 and earlier. FINDINGS: Lung volumes and mediastinal contours remain normal. Visualized tracheal air column is within normal limits. Both lungs appear stable and clear. EKG button artifact. No pneumothorax or pleural effusion. No osseous abnormality identified. Negative visible bowel gas pattern. IMPRESSION: Negative.  No acute cardiopulmonary abnormality. Electronically Signed   By: H  Hall M.D.   On: 07/13/2020 07:45    Procedures Procedures   Medications Ordered in ED Medications - No data to display  ED Course  I have reviewed the triage vital signs and the nursing notes.  Pertinent labs & imaging results that were available during my care of the patient were reviewed by me and considered in my medical decision making (see chart for details).    MDM Rules/Calculators/A&P                          Mdm: EKG and chest xray are normal.  Troponin is negative.  Pain is constant and seems muscular.   Final Clinical Impression(s) / ED Diagnoses Final diagnoses:  Chest wall pain    Rx / DC Orders ED Discharge Orders         Ordered    ibuprofen (ADVIL) 800 MG tablet  Every 8 hours PRN        02 /06/22 1322        An After Visit Summary was printed and given to the patient.    12-14-1997, Elson Areas 07/13/20 1322    09/10/20, MD 07/13/20 2051

## 2020-07-13 NOTE — ED Triage Notes (Signed)
C/o intermittent L sided chest pain x 1 week.  Also reports stiffness to R 4th digit.  No known injury.  No change in pain with movement.  Denies SOB, nausea, vomiting.

## 2020-09-30 ENCOUNTER — Ambulatory Visit (HOSPITAL_COMMUNITY)
Admission: EM | Admit: 2020-09-30 | Discharge: 2020-09-30 | Disposition: A | Discharge status: Home / Self Care | Payer: BC Managed Care – PPO | Attending: Student | Admitting: Student

## 2020-09-30 ENCOUNTER — Encounter (HOSPITAL_COMMUNITY): Payer: Self-pay

## 2020-09-30 ENCOUNTER — Other Ambulatory Visit: Payer: Self-pay

## 2020-09-30 DIAGNOSIS — R0789 Other chest pain: Secondary | ICD-10-CM

## 2020-09-30 MED ORDER — TIZANIDINE HCL 2 MG PO CAPS
2.0000 mg | ORAL_CAPSULE | Freq: Three times a day (TID) | ORAL | 0 refills | Status: DC
Start: 2020-09-30 — End: 2022-05-17
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
## 2020-09-30 NOTE — Discharge Instructions (Addendum)
-  Start the muscle relaxer-Zanaflex (tizanidine), up to 3 times daily for muscle spasms and pain.  This can make you drowsy, so take at bedtime or when you do not need to drive or operate machinery. -Take Tylenol 1000 mg 3 times daily, and ibuprofen 800 mg 3 times daily with food.  You can take these together, or alternate every 3-4 hours. -Seek additional medical attention if you develop chest pain at rest, dizziness, shortness of breath, worsening chest wall pain, etc.

## 2020-09-30 NOTE — ED Triage Notes (Signed)
Pt presents with persistent left shoulder pain for over 2 months; pt states he had a complete cardiac work at ED and all findings were normal but pain is ongoing.

## 2021-03-19 ENCOUNTER — Encounter (HOSPITAL_COMMUNITY): Payer: Self-pay | Admitting: Emergency Medicine

## 2021-03-19 ENCOUNTER — Emergency Department (HOSPITAL_COMMUNITY)
Admission: EM | Admit: 2021-03-19 | Discharge: 2021-03-19 | Disposition: A | Discharge status: Home / Self Care | Payer: BC Managed Care – PPO | Attending: Emergency Medicine | Admitting: Emergency Medicine

## 2021-03-19 ENCOUNTER — Emergency Department (HOSPITAL_COMMUNITY): Payer: BC Managed Care – PPO

## 2021-03-19 DIAGNOSIS — X58XXXA Exposure to other specified factors, initial encounter: Secondary | ICD-10-CM | POA: Insufficient documentation

## 2021-03-19 DIAGNOSIS — S62604A Fracture of unspecified phalanx of right ring finger, initial encounter for closed fracture: Secondary | ICD-10-CM | POA: Diagnosis not present

## 2021-03-19 DIAGNOSIS — M25512 Pain in left shoulder: Secondary | ICD-10-CM

## 2021-03-19 DIAGNOSIS — R0981 Nasal congestion: Secondary | ICD-10-CM | POA: Diagnosis not present

## 2021-03-19 DIAGNOSIS — M19012 Primary osteoarthritis, left shoulder: Secondary | ICD-10-CM | POA: Insufficient documentation

## 2021-03-19 DIAGNOSIS — S63614A Unspecified sprain of right ring finger, initial encounter: Secondary | ICD-10-CM | POA: Diagnosis present

## 2021-03-19 DIAGNOSIS — M1990 Unspecified osteoarthritis, unspecified site: Secondary | ICD-10-CM

## 2021-03-19 NOTE — ED Notes (Signed)
Pt ambulatory without assistance.  

## 2021-03-19 NOTE — ED Triage Notes (Signed)
Patient here from home reporting left shoulder pain, right ring finger pain, and nasal congestion/sinus pain.

## 2021-03-19 NOTE — Discharge Instructions (Addendum)
Follow-up with the orthopedist as needed.  You can use Voltaren gel to your shoulder and hand.  Return to emergency room if you have any worsening symptoms.

## 2021-03-19 NOTE — ED Provider Notes (Signed)
[facility]-EMERGENCY DEPT Provider Note   CSN: [id number] Arrival date & time: 03/19/21  7628     History Chief Complaint  Patient presents with   Hand Pain   Shoulder Pain   Nasal Congestion    Dominic Lowery is a 40 y.o. male.  Patient is a 40 year old male who presents with intermittent pain to his right shoulder.  He says it feels like it sore where his pectoralis muscle attaches to his shoulder.  It comes and goes.  Its been going on intermittently for about 2 months.  He denies any known injury.  He also had a prior fracture to his right ring finger.  He feels like over the last couple months its been stiff.  He says in the morning it feels like it needs some oil to get it going.  He denies any recent injuries.  No pain to the hand.  No other chest pain or shortness of breath.  He has not been taking thing for the symptoms.      History reviewed. No pertinent past medical history.  There are no problems to display for this patient.   History reviewed. No pertinent surgical history.     Family History  Problem Relation Age of Onset   Anemia Mother     Social History   Tobacco Use   Smoking status: Some Days    Types: Cigars   Smokeless tobacco: Never  Vaping Use   Vaping Use: Never used  Substance Use Topics   Alcohol use: Yes    Comment: occasionally   Drug use: Not Currently    Types: Marijuana    Home Medications Prior to Admission medications   Medication Sig Start Date End Date Taking? Authorizing Provider  ibuprofen (ADVIL) 800 MG tablet Take 1 tablet (800 mg total) by mouth every 8 (eight) hours as needed. 07/13/20   Elson Areas, PA-C  tizanidine (ZANAFLEX) 2 MG capsule Take 1 capsule (2 mg total) by mouth 3 (three) times daily. 09/30/20   Rhys Martini, PA-C    Allergies    Patient has no known allergies.  Review of Systems   Review of Systems  Constitutional:  Negative for fever.  Gastrointestinal:  Negative for  nausea and vomiting.  Musculoskeletal:  Positive for arthralgias. Negative for back pain, joint swelling and neck pain.  Skin:  Negative for wound.  Neurological:  Negative for weakness, numbness and headaches.   Physical Exam Updated Vital Signs BP 128/90 (BP Location: Right Arm)   Pulse 85   Temp 98.1 F (36.7 C) (Oral)   Resp 18   SpO2 99%   Physical Exam Constitutional:      Appearance: He is well-developed.  HENT:     Head: Normocephalic and atraumatic.  Cardiovascular:     Rate and Rhythm: Normal rate.  Pulmonary:     Effort: Pulmonary effort is normal.  Musculoskeletal:        General: Tenderness present.     Cervical back: Normal range of motion and neck supple.     Comments: There is mild tenderness to the left shoulder at the insertion site of the pectoralis muscle.  There is no swelling to the shoulder.  There is some pain on abduction of the shoulder.  No deformity.  There is no numbness or weakness in the hand.  He has normal sensation and motor function distally.  Radial pulses are intact.  The right hand is without deformity or swelling.  There is no bony tenderness noted.  He has full range of motion in all fingers.  Neurovascular function is intact distally.  Skin:    General: Skin is warm and dry.  Neurological:     Mental Status: He is alert and oriented to person, place, and time.    ED Results / Procedures / Treatments   Labs (all labs ordered are listed, but only abnormal results are displayed) Labs Reviewed - No data to display  EKG None  Radiology DG Shoulder Left  Result Date: 03/19/2021 CLINICAL DATA:  Pain EXAM: LEFT SHOULDER - 2+ VIEW COMPARISON:  None. FINDINGS: There is no evidence of acute fracture or dislocation. There is no significant arthritis. Soft tissues are unremarkable. IMPRESSION: No acute fracture or dislocation. Electronically Signed   By: Caprice Renshaw M.D.   On: 03/19/2021 11:17   DG Finger Ring Right  Result Date:  03/19/2021 CLINICAL DATA:  Pain EXAM: RIGHT RING FINGER 2+V COMPARISON:  11/14/2014 radiograph. FINDINGS: There is a well corticated tiny fragment adjacent to the ulnar aspect of the distal phalangeal joint of the ring finger, compatible with old injury, unchanged since June 2016 radiograph. There is no evidence of acute fracture. Old healed fracture of the fifth metacarpal. IMPRESSION: No acute osseous abnormality. Evidence of old injury along the ulnar aspect of the distal interphalangeal joint of the ring finger. Old healed posttraumatic deformity of the fifth metacarpal. Electronically Signed   By: Caprice Renshaw M.D.   On: 03/19/2021 11:20    Procedures Procedures   Medications Ordered in ED Medications - No data to display  ED Course  I have reviewed the triage vital signs and the nursing notes.  Pertinent labs & imaging results that were available during my care of the patient were reviewed by me and considered in my medical decision making (see chart for details).    MDM Rules/Calculators/A&P                           Patient has some intermittent pain to his left shoulder.  Appears to be musculoskeletal.  Imaging studies are unremarkable.  These were interpreted by me as well.  He does not have symptoms that sound more concerning for ACS.  His pain is reproducible on palpation.  He has some stiffness in his right ring finger.  There is no acute abnormalities on imaging.  He has evidence of an old fracture.  This is likely arthritic changes.  He has full range of motion.  No suggestions of infection.  He was discharged home in good condition.  He was advised to use Voltaren gel.  He was given a referral to follow-up with orthopedics if his symptoms are improving. Final Clinical Impression(s) / ED Diagnoses Final diagnoses:  Acute pain of left shoulder  Arthritis    Rx / DC Orders ED Discharge Orders     None        Rolan Bucco, MD 03/19/21 1211

## 2021-06-10 ENCOUNTER — Encounter: Payer: Self-pay | Admitting: Family Medicine

## 2021-10-27 ENCOUNTER — Emergency Department (HOSPITAL_COMMUNITY)
Admission: EM | Admit: 2021-10-27 | Discharge: 2021-10-27 | Disposition: A | Discharge status: Home / Self Care | Payer: BC Managed Care – PPO | Attending: Emergency Medicine | Admitting: Emergency Medicine

## 2021-10-27 ENCOUNTER — Encounter (HOSPITAL_COMMUNITY): Payer: Self-pay | Admitting: Emergency Medicine

## 2021-10-27 ENCOUNTER — Emergency Department (HOSPITAL_COMMUNITY): Payer: BC Managed Care – PPO

## 2021-10-27 ENCOUNTER — Other Ambulatory Visit (HOSPITAL_COMMUNITY): Payer: Self-pay

## 2021-10-27 ENCOUNTER — Other Ambulatory Visit: Payer: Self-pay

## 2021-10-27 DIAGNOSIS — S199XXA Unspecified injury of neck, initial encounter: Secondary | ICD-10-CM | POA: Diagnosis present

## 2021-10-27 DIAGNOSIS — M546 Pain in thoracic spine: Secondary | ICD-10-CM | POA: Insufficient documentation

## 2021-10-27 DIAGNOSIS — S161XXA Strain of muscle, fascia and tendon at neck level, initial encounter: Secondary | ICD-10-CM | POA: Diagnosis not present

## 2021-10-27 DIAGNOSIS — Y92410 Unspecified street and highway as the place of occurrence of the external cause: Secondary | ICD-10-CM | POA: Insufficient documentation

## 2021-10-27 MED ORDER — CYCLOBENZAPRINE HCL 10 MG PO TABS: 10.0000 mg | ORAL_TABLET | Freq: Two times a day (BID) | ORAL | 0 refills | Status: DC | PRN

## 2021-10-27 MED ORDER — NAPROXEN 375 MG PO TABS: 375.0000 mg | ORAL_TABLET | Freq: Two times a day (BID) | ORAL | 0 refills | Status: DC
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
## 2021-10-27 NOTE — Discharge Instructions (Signed)
Your symptoms should improve over the next week.  Take the medications as needed for pain and discomfort.
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
## 2022-05-17 ENCOUNTER — Encounter (HOSPITAL_COMMUNITY): Payer: Self-pay

## 2022-05-17 ENCOUNTER — Encounter (HOSPITAL_COMMUNITY): Payer: Self-pay | Admitting: *Deleted

## 2022-05-17 ENCOUNTER — Other Ambulatory Visit: Payer: Self-pay

## 2022-05-17 ENCOUNTER — Ambulatory Visit (HOSPITAL_COMMUNITY)
Admission: EM | Admit: 2022-05-17 | Discharge: 2022-05-17 | Disposition: A | Discharge status: Home / Self Care | Payer: BC Managed Care – PPO | Attending: Family Medicine | Admitting: Family Medicine

## 2022-05-17 ENCOUNTER — Emergency Department (HOSPITAL_COMMUNITY)
Admission: EM | Admit: 2022-05-17 | Discharge: 2022-05-17 | Discharge status: Against Medical Advice | Payer: BC Managed Care – PPO | Attending: Emergency Medicine | Admitting: Emergency Medicine

## 2022-05-17 DIAGNOSIS — Z1152 Encounter for screening for COVID-19: Secondary | ICD-10-CM | POA: Diagnosis not present

## 2022-05-17 DIAGNOSIS — R519 Headache, unspecified: Secondary | ICD-10-CM | POA: Diagnosis present

## 2022-05-17 DIAGNOSIS — Z5321 Procedure and treatment not carried out due to patient leaving prior to being seen by health care provider: Secondary | ICD-10-CM | POA: Diagnosis not present

## 2022-05-17 DIAGNOSIS — B974 Respiratory syncytial virus as the cause of diseases classified elsewhere: Secondary | ICD-10-CM | POA: Diagnosis not present

## 2022-05-17 DIAGNOSIS — J069 Acute upper respiratory infection, unspecified: Secondary | ICD-10-CM | POA: Diagnosis not present

## 2022-05-17 DIAGNOSIS — R0981 Nasal congestion: Secondary | ICD-10-CM | POA: Diagnosis not present

## 2022-05-17 LAB — RESP PANEL BY RT-PCR (RSV, FLU A&B, COVID)  RVPGX2
Influenza A by PCR: NEGATIVE
Influenza B by PCR: NEGATIVE
Resp Syncytial Virus by PCR: POSITIVE — AB
SARS Coronavirus 2 by RT PCR: NEGATIVE

## 2022-05-17 MED ORDER — ACETAMINOPHEN 500 MG PO TABS: 1000.0000 mg | ORAL_TABLET | Freq: Once | ORAL | Status: DC

## 2022-05-17 MED ORDER — BENZONATATE 100 MG PO CAPS: 100.0000 mg | ORAL_CAPSULE | Freq: Three times a day (TID) | ORAL | 0 refills | Status: DC | PRN

## 2022-05-17 NOTE — ED Provider Notes (Signed)
[facility]    CSN: [id number] Arrival date & time: 05/17/22  1729      History   Chief Complaint Chief Complaint  Patient presents with   Cough   Nasal Congestion   Hoarse   Sore Throat   Headache    HPI Dominic Lowery is a 41 y.o. male.    Cough Associated symptoms: headaches   Sore Throat Associated symptoms include headaches.  Headache Associated symptoms: cough    Here for cough, nasal congestion and rhinorrhea, hoarseness, and sore throat.  Symptoms began overnight December 8 into December 9. He has had some headache also.  He has had decreased appetite, but no vomiting or diarrhea or nausea  He has maybe had a little trouble breathing, but no wheezing. History reviewed. No pertinent past medical history.  There are no problems to display for this patient.   History reviewed. No pertinent surgical history.     Home Medications    Prior to Admission medications   Medication Sig Start Date End Date Taking? Authorizing Provider  benzonatate (TESSALON) 100 MG capsule Take 1 capsule (100 mg total) by mouth 3 (three) times daily as needed for cough. 05/17/22  Yes Gracious Renken, Janace Aris, MD    Family History Family History  Problem Relation Age of Onset   Anemia Mother     Social History Social History   Tobacco Use   Smoking status: Some Days    Types: Cigars   Smokeless tobacco: Never  Vaping Use   Vaping Use: Never used  Substance Use Topics   Alcohol use: Yes    Comment: occasionally   Drug use: Not Currently    Types: Marijuana     Allergies   Patient has no known allergies.   Review of Systems Review of Systems  Respiratory:  Positive for cough.   Neurological:  Positive for headaches.     Physical Exam Triage Vital Signs ED Triage Vitals  Enc Vitals Group     BP 05/17/22 1930 (!) 148/99     Pulse Rate 05/17/22 1930 87     Resp 05/17/22 1930 18     Temp 05/17/22 1930 98.3 F (36.8 C)     Temp src --       SpO2 05/17/22 1930 98 %     Weight --      Height --      Head Circumference --      Peak Flow --      Pain Score 05/17/22 1928 6     Pain Loc --      Pain Edu? --      Excl. in GC? --    No data found.  Updated Vital Signs BP (!) 148/99   Pulse 87   Temp 98.3 F (36.8 C)   Resp 18   SpO2 98%   Visual Acuity Right Eye Distance:   Left Eye Distance:   Bilateral Distance:    Right Eye Near:   Left Eye Near:    Bilateral Near:     Physical Exam Vitals reviewed.  Constitutional:      General: He is not in acute distress.    Appearance: He is not ill-appearing, toxic-appearing or diaphoretic.  HENT:     Right Ear: Tympanic membrane and ear canal normal.     Left Ear: Tympanic membrane and ear canal normal.     Nose: Nose normal.     Mouth/Throat:     Mouth: Mucous membranes  are moist.     Pharynx: No oropharyngeal exudate or posterior oropharyngeal erythema.  Eyes:     Extraocular Movements: Extraocular movements intact.     Conjunctiva/sclera: Conjunctivae normal.     Pupils: Pupils are equal, round, and reactive to light.  Cardiovascular:     Rate and Rhythm: Normal rate and regular rhythm.     Heart sounds: No murmur heard. Pulmonary:     Effort: No respiratory distress.     Breath sounds: No stridor. No wheezing, rhonchi or rales.  Musculoskeletal:     Cervical back: Neck supple.  Lymphadenopathy:     Cervical: No cervical adenopathy.  Skin:    Capillary Refill: Capillary refill takes less than 2 seconds.     Coloration: Skin is not jaundiced or pale.  Neurological:     General: No focal deficit present.     Mental Status: He is alert and oriented to person, place, and time.  Psychiatric:        Behavior: Behavior normal.      UC Treatments / Results  Labs (all labs ordered are listed, but only abnormal results are displayed) Labs Reviewed - No data to display  EKG   Radiology No results found.  Procedures Procedures (including critical  care time)  Medications Ordered in UC Medications - No data to display  Initial Impression / Assessment and Plan / UC Course  I have reviewed the triage vital signs and the nursing notes.  Pertinent labs & imaging results that were available during my care of the patient were reviewed by me and considered in my medical decision making (see chart for details).        He had presented first to [facility] this morning, and they had swabbed him for COVID, flu, and RSV.  The flu and the COVID test were negative, but RSV was positive.  We have discussed symptomatic treatment, and Tessalon Perles are sent in for the cough. Final Clinical Impressions(s) / UC Diagnoses   Final diagnoses:  Viral URI with cough     Discharge Instructions      Your test this morning was positive for RSV.  There is no specific treatment, but you should treat your symptoms to help you feel better.  Take benzonatate 100 mg, 1 tab every 8 hours as needed for cough.  He can take Mucinex or Mucinex D as needed for the congestion.  Make sure you are getting enough fluids in     ED Prescriptions     Medication Sig Dispense Auth. Provider   benzonatate (TESSALON) 100 MG capsule Take 1 capsule (100 mg total) by mouth 3 (three) times daily as needed for cough. 21 capsule Zenia Resides, MD      I have reviewed the PDMP during this encounter.   Zenia Resides, MD 05/17/22 2004

## 2022-05-17 NOTE — ED Triage Notes (Signed)
Pt reports since SAT he has had a horse throat ,cough and nasal congestion.

## 2022-05-17 NOTE — Discharge Instructions (Signed)
Your test this morning was positive for RSV.  There is no specific treatment, but you should treat your symptoms to help you feel better.  Take benzonatate 100 mg, 1 tab every 8 hours as needed for cough.  He can take Mucinex or Mucinex D as needed for the congestion.  Make sure you are getting enough fluids in

## 2022-05-17 NOTE — ED Triage Notes (Signed)
Patient has had congestion and a headache since Friday.

## 2022-08-09 ENCOUNTER — Other Ambulatory Visit: Payer: Self-pay

## 2022-08-09 ENCOUNTER — Emergency Department (HOSPITAL_BASED_OUTPATIENT_CLINIC_OR_DEPARTMENT_OTHER)
Admission: EM | Admit: 2022-08-09 | Discharge: 2022-08-09 | Disposition: A | Discharge status: Home / Self Care | Payer: BC Managed Care – PPO | Attending: Emergency Medicine | Admitting: Emergency Medicine

## 2022-08-09 ENCOUNTER — Encounter (HOSPITAL_BASED_OUTPATIENT_CLINIC_OR_DEPARTMENT_OTHER): Payer: Self-pay | Admitting: Emergency Medicine

## 2022-08-09 ENCOUNTER — Emergency Department (HOSPITAL_BASED_OUTPATIENT_CLINIC_OR_DEPARTMENT_OTHER): Payer: BC Managed Care – PPO | Admitting: Radiology

## 2022-08-09 DIAGNOSIS — R0789 Other chest pain: Secondary | ICD-10-CM | POA: Diagnosis present

## 2022-08-09 LAB — CBC
HCT: 41.1 % (ref 39.0–52.0)
Hemoglobin: 13.8 g/dL (ref 13.0–17.0)
MCH: 29 pg (ref 26.0–34.0)
MCHC: 33.6 g/dL (ref 30.0–36.0)
MCV: 86.3 fL (ref 80.0–100.0)
Platelets: 277 10*3/uL (ref 150–400)
RBC: 4.76 MIL/uL (ref 4.22–5.81)
RDW: 12.9 % (ref 11.5–15.5)
WBC: 6 10*3/uL (ref 4.0–10.5)
nRBC: 0 % (ref 0.0–0.2)

## 2022-08-09 LAB — BASIC METABOLIC PANEL
Anion gap: 6 (ref 5–15)
BUN: 15 mg/dL (ref 6–20)
CO2: 29 mmol/L (ref 22–32)
Calcium: 9.5 mg/dL (ref 8.9–10.3)
Chloride: 105 mmol/L (ref 98–111)
Creatinine, Ser: 1.06 mg/dL (ref 0.61–1.24)
GFR, Estimated: >60 mL/min (ref >60)
Glucose, Bld: 74 mg/dL (ref 70–99)
Potassium: 3.7 mmol/L (ref 3.5–5.1)
Sodium: 140 mmol/L (ref 135–145)

## 2022-08-09 LAB — TROPONIN I (HIGH SENSITIVITY): Troponin I (High Sensitivity): 4 ng/L (ref <18)

## 2022-08-09 MED ORDER — LIDOCAINE 5 % EX PTCH
1.0000 | MEDICATED_PATCH | CUTANEOUS | Status: DC
  Administered 2022-08-09: 1 via TRANSDERMAL
  Filled 2022-08-09: qty 1

## 2022-08-09 MED ORDER — KETOROLAC TROMETHAMINE 60 MG/2ML IM SOLN
60.0000 mg | Freq: Once | INTRAMUSCULAR | Status: AC
  Administered 2022-08-09: 60 mg via INTRAMUSCULAR
  Filled 2022-08-09: qty 2

## 2022-08-09 MED ORDER — IBUPROFEN 800 MG PO TABS: 800.0000 mg | ORAL_TABLET | Freq: Three times a day (TID) | ORAL | 0 refills | Status: AC

## 2022-08-09 NOTE — Discharge Instructions (Addendum)
Your EKG, chest x-ray and initial laboratory evaluation was reassuring.  Your physical exam is concerning for musculoskeletal chest pain.  Recommend over-the-counter lidocaine patches as well as ibuprofen for pain control.

## 2022-08-09 NOTE — ED Notes (Signed)
Discharge paperwork given and verbally understood. 

## 2022-08-09 NOTE — ED Provider Notes (Addendum)
[facility] Provider Note   CSN: [id number] Arrival date & time: 08/09/22  0805     History  Chief Complaint  Patient presents with   Chest Pain    Dominic Lowery is a 42 y.o. male.  HPI   42 year old male with medical history significant for musculoskeletal chest pain who presents to the emergency department with intermittent chest pain for the last 3 months of the left chest wall.  The patient states that he does a lot of heavy lifting for his job.  He has intermittently had left-sided chest pain.  No shortness of breath, nausea, vomiting, diaphoresis.  He states that the pain is worse with movement.  Denies any shoulder pain.  He denies any falls or recent injury.  Home Medications Prior to Admission medications   Medication Sig Start Date End Date Taking? Authorizing Provider  ibuprofen (ADVIL) 800 MG tablet Take 1 tablet (800 mg total) by mouth 3 (three) times daily. 08/09/22  Yes Regan Lemming, MD  benzonatate (TESSALON) 100 MG capsule Take 1 capsule (100 mg total) by mouth 3 (three) times daily as needed for cough. 05/17/22   Barrett Henle, MD      Allergies    Patient has no known allergies.    Review of Systems   Review of Systems  Cardiovascular:  Positive for chest pain.  All other systems reviewed and are negative.   Physical Exam Updated Vital Signs BP (!) 140/90   Pulse 72   Temp 97.9 F (36.6 C) (Oral)   Resp 17   Ht 6' (1.829 m)   Wt 77.1 kg   SpO2 100%   BMI 23.06 kg/m  Physical Exam Vitals and nursing note reviewed.  Constitutional:      General: He is not in acute distress.    Appearance: He is well-developed.  HENT:     Head: Normocephalic and atraumatic.  Eyes:     Conjunctiva/sclera: Conjunctivae normal.  Cardiovascular:     Rate and Rhythm: Normal rate and regular rhythm.     Heart sounds: No murmur heard. Pulmonary:     Effort: Pulmonary effort is normal. No respiratory distress.      Breath sounds: Normal breath sounds.  Chest:     Chest wall: Tenderness present.     Comments: Left-sided pectoralis tenderness that reproduces the patient's pain Abdominal:     Palpations: Abdomen is soft.     Tenderness: There is no abdominal tenderness.  Musculoskeletal:        General: No swelling.     Cervical back: Neck supple.  Skin:    General: Skin is warm and dry.     Capillary Refill: Capillary refill takes less than 2 seconds.  Neurological:     Mental Status: He is alert.  Psychiatric:        Mood and Affect: Mood normal.     ED Results / Procedures / Treatments   Labs (all labs ordered are listed, but only abnormal results are displayed) Labs Reviewed  BASIC METABOLIC PANEL  CBC  TROPONIN I (HIGH SENSITIVITY)    EKG EKG Interpretation  Date/Time:  Monday August 09 2022 08:13:09 EST Ventricular Rate:  72 PR Interval:  155 QRS Duration: 87 QT Interval:  379 QTC Calculation: 415 R Axis:   33 Text Interpretation: Sinus rhythm No significant change since last tracing Confirmed by Regan Lemming ([id number]) on 08/09/2022 8:27:35 AM  Radiology DG Chest 2 View  Result Date:  08/09/2022 CLINICAL DATA:  Chest pain EXAM: CHEST - 2 VIEW COMPARISON:  CXR 07/13/20 FINDINGS: No pleural effusion. No pneumothorax. No focal airspace opacity. Normal cardiac and mediastinal contours. Visualized upper abdomen is unremarkable. No radiographically apparent displaced rib fractures. Vertebral body heights are maintained. IMPRESSION: No focal airspace opacity. Electronically Signed   By: Marin Roberts M.D.   On: 08/09/2022 08:28    Procedures Procedures    Medications Ordered in ED Medications  lidocaine (LIDODERM) 5 % 1 patch (1 patch Transdermal Patch Applied 08/09/22 0850)  ketorolac (TORADOL) injection 60 mg (60 mg Intramuscular Given 08/09/22 0850)    ED Course/ Medical Decision Making/ A&P             HEART Score: 0                Medical Decision Making Amount and/or  Complexity of Data Reviewed Labs: ordered. Radiology: ordered.  Risk Prescription drug management.    42 year old male with medical history significant for musculoskeletal chest pain who presents to the emergency department with intermittent chest pain for the last 3 months of the left chest wall.  The patient states that he does a lot of heavy lifting for his job.  He has intermittently had left-sided chest pain.  No shortness of breath, nausea, vomiting, diaphoresis.  He states that the pain is worse with movement.  Denies any shoulder pain.  He denies any falls or recent injury.  On arrival, the patient was vitally stable, afebrile, not tachycardic or tachypneic, BP 149/104, saturating 100% on room air.  Physical exam significant for reproducible left-sided chest wall tenderness to palpation consistent with likely musculoskeletal pain.   Patient was administered a lidocaine patch and Toradol for pain control.  Initial EKG revealed sinus rhythm, ventricular rate 72, no significant changes compared to prior EKGs, chest x-ray revealed no acute abnormality with no focal airspace disease.  Initial laboratory evaluation with a troponin of 4, BMP unremarkable, CBC unremarkable.  Heart score of 0.  Denies cocaine use.  His symptoms are consistent with likely musculoskeletal chest wall pain.  Recommended that the patient try over-the-counter lidocaine patch, ibuprofen for pain control.  Low concern for ACS, PE, other acute abnormality requiring further workup or intervention.  Stable for discharge with return precautions provided.   Final Clinical Impression(s) / ED Diagnoses Final diagnoses:  Musculoskeletal chest pain    Rx / DC Orders ED Discharge Orders          Ordered    ibuprofen (ADVIL) 800 MG tablet  3 times daily        08/09/22 0859              Regan Lemming, MD 08/09/22 0901    Regan Lemming, MD 08/09/22 0901    Regan Lemming, MD 08/09/22 0901

## 2022-08-09 NOTE — ED Triage Notes (Signed)
Pt arrives to ED with c/o intermittent left sided chest pain x3 months.

## 2022-11-21 ENCOUNTER — Emergency Department (HOSPITAL_BASED_OUTPATIENT_CLINIC_OR_DEPARTMENT_OTHER): Payer: BC Managed Care – PPO | Admitting: Radiology

## 2022-11-21 ENCOUNTER — Emergency Department (HOSPITAL_BASED_OUTPATIENT_CLINIC_OR_DEPARTMENT_OTHER)
Admission: EM | Admit: 2022-11-21 | Discharge: 2022-11-21 | Disposition: A | Discharge status: Home / Self Care | Payer: BC Managed Care – PPO | Attending: Emergency Medicine | Admitting: Emergency Medicine

## 2022-11-21 ENCOUNTER — Encounter (HOSPITAL_BASED_OUTPATIENT_CLINIC_OR_DEPARTMENT_OTHER): Payer: Self-pay

## 2022-11-21 DIAGNOSIS — W182XXA Fall in (into) shower or empty bathtub, initial encounter: Secondary | ICD-10-CM | POA: Insufficient documentation

## 2022-11-21 DIAGNOSIS — S52501A Unspecified fracture of the lower end of right radius, initial encounter for closed fracture: Secondary | ICD-10-CM | POA: Diagnosis not present

## 2022-11-21 DIAGNOSIS — M25531 Pain in right wrist: Secondary | ICD-10-CM | POA: Diagnosis not present

## 2022-11-21 DIAGNOSIS — S59911A Unspecified injury of right forearm, initial encounter: Secondary | ICD-10-CM | POA: Diagnosis present

## 2022-11-21 DIAGNOSIS — Y92002 Bathroom of unspecified non-institutional (private) residence single-family (private) house as the place of occurrence of the external cause: Secondary | ICD-10-CM | POA: Diagnosis not present

## 2022-11-21 DIAGNOSIS — S62101A Fracture of unspecified carpal bone, right wrist, initial encounter for closed fracture: Secondary | ICD-10-CM

## 2022-11-21 MED ORDER — HYDROCODONE-ACETAMINOPHEN 5-325 MG PO TABS
1.0000 | ORAL_TABLET | Freq: Once | ORAL | Status: AC
  Administered 2022-11-21: 1 via ORAL
  Filled 2022-11-21: qty 1

## 2022-11-21 MED ORDER — HYDROCODONE-ACETAMINOPHEN 5-325 MG PO TABS: 1.0000 | ORAL_TABLET | Freq: Four times a day (QID) | ORAL | 0 refills | Status: DC | PRN

## 2022-11-21 NOTE — Discharge Instructions (Addendum)
Recommend Tylenol and ibuprofen in addition to Norco for pain control, follow-up outpatient with hand surgery

## 2022-11-21 NOTE — ED Triage Notes (Signed)
States slipped and fell landing on right wrist.  Limited movement of wrist.  Good sensation to fingers  swelling noted

## 2022-11-21 NOTE — ED Provider Notes (Signed)
[facility]PART[facility] AT [facility] Provider Note   CSN: [id number] Arrival date & time: 11/21/22  1021     History  Chief Complaint  Patient presents with   Wrist Pain    Dominic Lowery is a 42 y.o. male.   Wrist Pain     42 year old male presenting to the emergency department after a fall out of the shower yesterday.  The patient states that he slipped and fell landing onto his right wrist.  He states that he is right-handed.  He has been having limited range of motion of the wrist with pain along the medial and lateral aspect of the wrist.  He denies any numbness to the wrist.  He denies any other injuries or complaints.  Denies any head trauma or loss of consciousness.  He is not on anticoagulation.  He has been trying to utilize an old thumb spica splint that he had on hand.  He arrives to the emergency department GCS 15, ABC intact.  Home Medications Prior to Admission medications   Medication Sig Start Date End Date Taking? Authorizing Provider  HYDROcodone-acetaminophen (NORCO/VICODIN) 5-325 MG tablet Take 1-2 tablets by mouth every 6 (six) hours as needed. 11/21/22  Yes Ernie Avena, MD  benzonatate (TESSALON) 100 MG capsule Take 1 capsule (100 mg total) by mouth 3 (three) times daily as needed for cough. 05/17/22   Zenia Resides, MD  ibuprofen (ADVIL) 800 MG tablet Take 1 tablet (800 mg total) by mouth 3 (three) times daily. 08/09/22   Ernie Avena, MD      Allergies    Patient has no known allergies.    Review of Systems   Review of Systems  Musculoskeletal:  Positive for arthralgias.  All other systems reviewed and are negative.   Physical Exam Updated Vital Signs BP (!) 138/102 (BP Location: Right Arm)   Pulse 79   Temp 98.5 F (36.9 C) (Oral)   Resp 18   Ht 6' (1.829 m)   Wt 74.8 kg   SpO2 100%   BMI 22.38 kg/m  Physical Exam Vitals and nursing note reviewed.  Constitutional:      General: He is not in acute  distress. HENT:     Head: Normocephalic and atraumatic.  Eyes:     Conjunctiva/sclera: Conjunctivae normal.     Pupils: Pupils are equal, round, and reactive to light.  Cardiovascular:     Rate and Rhythm: Normal rate and regular rhythm.  Pulmonary:     Effort: Pulmonary effort is normal. No respiratory distress.  Abdominal:     General: There is no distension.     Tenderness: There is no guarding.  Musculoskeletal:        General: Swelling, tenderness and signs of injury present.     Cervical back: Neck supple.     Comments: Right hand neurovascularly intact, intact 2+ radial pulses, intact motor function of the median, ulnar, radial nerve distributions.  Tenderness to palpation about the distal radius and ulna.  Mild soft tissue swelling present  Skin:    Findings: No lesion or rash.  Neurological:     General: No focal deficit present.     Mental Status: He is alert. Mental status is at baseline.     ED Results / Procedures / Treatments   Labs (all labs ordered are listed, but only abnormal results are displayed) Labs Reviewed - No data to display  EKG None  Radiology DG Wrist Complete Right  Result Date:  11/21/2022 CLINICAL DATA:  Fall in shower last night.  Wrist pain and swelling. EXAM: RIGHT WRIST - COMPLETE 4 VIEW COMPARISON:  None Available. FINDINGS: Transverse fracture through the distal radial metaphysis is offset approximately 3 mm posteriorly. No definite involvement of the articular surface is evident. The tiny avulsion fracture is present at the ulnar styloid. Carpal bones are intact. Remote fifth metacarpal fracture is noted. IMPRESSION: 1. Transverse fracture through the distal radial metaphysis with approximately 3 mm offset posteriorly. 2. Tiny avulsion fracture at the ulnar styloid. Electronically Signed   By: Marin Roberts M.D.   On: 11/21/2022 11:12    Procedures .Ortho Injury Treatment  Date/Time: 11/21/2022 12:23 PM  Performed by: Ernie Avena, MD Authorized by: Ernie Avena, MD   Consent:    Consent obtained:  Verbal   Consent given by:  PatientInjury location: wrist Location details: right wrist Injury type: fracture Fracture type: distal radius and ulnar styloid Pre-procedure neurovascular assessment: neurovascularly intact Pre-procedure range of motion: reduced  Anesthesia: Local anesthesia used: no  Patient sedated: NoImmobilization: splint Splint type: volar short arm Splint Applied by: ED Tech Supplies used: plaster Post-procedure neurovascular assessment: post-procedure neurovascularly intact Post-procedure distal perfusion: normal Post-procedure neurological function: normal Post-procedure range of motion: unchanged       Medications Ordered in ED Medications  HYDROcodone-acetaminophen (NORCO/VICODIN) 5-325 MG per tablet 1-2 tablet (1 tablet Oral Given 11/21/22 1300)    ED Course/ Medical Decision Making/ A&P                             Medical Decision Making Amount and/or Complexity of Data Reviewed Radiology: ordered.  Risk Prescription drug management.    42 year old male presenting to the emergency department after a fall out of the shower yesterday.  The patient states that he slipped and fell landing onto his right wrist.  He states that he is right-handed.  He has been having limited range of motion of the wrist with pain along the medial and lateral aspect of the wrist.  He denies any numbness to the wrist.  He denies any other injuries or complaints.  Denies any head trauma or loss of consciousness.  He is not on anticoagulation.  He has been trying to utilize an old thumb spica splint that he had on hand.  He arrives to the emergency department GCS 15, ABC intact.  On arrival, the patient was vitally stable.  Presenting with wrist pain after a FOOSH injury.  Physical exam concerning for wrist fracture, patient arrives neurovascularly intact.  No significant deformity of the wrist  noted on exam.  Mild swelling present.  Norco was provided for pain control.  X-ray imaging of the wrist was performed and results are as follows: IMPRESSION:  1. Transverse fracture through the distal radial metaphysis with  approximately 3 mm offset posteriorly.  2. Tiny avulsion fracture at the ulnar styloid.    Will place the patient in a short arm volar wrist splint, hand surgery consulted to arrange for follow-up.  Spoke with Dr. Frazier Butt of on-call hand surgery. Patient neurovascularly intact status post splinting.  Norco prescribed for pain control.  Will have the patient follow-up with hand in 7-10 days time.   Final Clinical Impression(s) / ED Diagnoses Final diagnoses:  Closed fracture of right wrist, initial encounter    Rx / DC Orders ED Discharge Orders          Ordered    Ambulatory  referral to Hand Surgery        11/21/22 1235    HYDROcodone-acetaminophen (NORCO/VICODIN) 5-325 MG tablet  Every 6 hours PRN        11/21/22 1235              Ernie Avena, MD 11/21/22 1318

## 2022-11-25 ENCOUNTER — Other Ambulatory Visit: Payer: Self-pay

## 2022-11-25 ENCOUNTER — Encounter (HOSPITAL_BASED_OUTPATIENT_CLINIC_OR_DEPARTMENT_OTHER): Payer: Self-pay | Admitting: Orthopedic Surgery

## 2022-11-28 NOTE — H&P (Signed)
Dominic Lowery is an 42 y.o. male.   Chief Complaint: Right wrist injury HPI: ????42 year old male presenting to the emergency department after a fall out of the shower The patient states that he slipped and fell landing onto his right wrist.  He states that he is right-handed. He was seen and evaluated in office and here for surgery today    Past Medical History:  Diagnosis Date   Medical history non-contributory     Past Surgical History:  Procedure Laterality Date   NO PAST SURGERIES      Family History  Problem Relation Age of Onset   Anemia Mother    Social History:  reports that he has been smoking cigars. He has never used smokeless tobacco. He reports current alcohol use. He reports that he does not currently use drugs.  Allergies: No Known Allergies  No medications prior to admission.    No results found for this or any previous visit (from the past 48 hour(s)). No results found.  ROS NO RECENT ILLNESSES OR HOSPITALIZATIONS  Height 6' (1.829 m), weight 74.8 kg. Physical Exam   General Appearance:  Alert, cooperative, no distress, appears stated age  Head:  Normocephalic, without obvious abnormality, atraumatic  Eyes:  Pupils equal, conjunctiva/corneas clear,         Throat: Lips, mucosa, and tongue normal; teeth and gums normal  Neck: No visible masses     Lungs:   respirations unlabored  Chest Wall:  No tenderness or deformity  Heart:  Regular rate and rhythm,  Abdomen:   Soft, non-tender,         Extremities: RUE: SKIN INTACT, FINGERS WARM WELL PERFUSED GOOD DIGITAL MOTION  Pulses: 2+ and symmetric  Skin: Skin color, texture, turgor normal, no rashes or lesions     Neurologic: Normal     Assessment/Plan RIGHT DISTAL RADIUS FRACTURE DISPLACED  RIGHT DISTAL RADIUS OPEN REDUCTION AND INTERNAL FIXATION AND REPAIR AS INDICATED  R/B/A DISCUSSED WITH PT IN OFFICE.  PT VOICED UNDERSTANDING OF PLAN CONSENT SIGNED DAY OF SURGERY PT SEEN AND EXAMINED  PRIOR TO OPERATIVE PROCEDURE/DAY OF SURGERY SITE MARKED. QUESTIONS ANSWERED WILL GO HOME FOLLOWING SURGERY   WE ARE PLANNING SURGERY FOR YOUR UPPER EXTREMITY. THE RISKS AND BENEFITS OF SURGERY INCLUDE BUT NOT LIMITED TO BLEEDING INFECTION, DAMAGE TO NEARBY NERVES ARTERIES TENDONS, FAILURE OF SURGERY TO ACCOMPLISH ITS INTENDED GOALS, PERSISTENT SYMPTOMS AND NEED FOR FURTHER SURGICAL INTERVENTION. WITH THIS IN MIND WE WILL PROCEED. I HAVE DISCUSSED WITH THE PATIENT THE PRE AND POSTOPERATIVE REGIMEN AND THE DOS AND DON'TS. PT VOICED UNDERSTANDING AND INFORMED CONSENT SIGNED.   Dominic Lowery [facility] 11/28/2022, 9:08 PM

## 2022-11-29 ENCOUNTER — Encounter (HOSPITAL_BASED_OUTPATIENT_CLINIC_OR_DEPARTMENT_OTHER): Payer: Self-pay | Admitting: Orthopedic Surgery

## 2022-11-29 ENCOUNTER — Ambulatory Visit (HOSPITAL_BASED_OUTPATIENT_CLINIC_OR_DEPARTMENT_OTHER): Payer: BC Managed Care – PPO | Admitting: Anesthesiology

## 2022-11-29 ENCOUNTER — Ambulatory Visit (HOSPITAL_BASED_OUTPATIENT_CLINIC_OR_DEPARTMENT_OTHER): Payer: BC Managed Care – PPO

## 2022-11-29 ENCOUNTER — Other Ambulatory Visit: Payer: Self-pay

## 2022-11-29 ENCOUNTER — Ambulatory Visit (HOSPITAL_BASED_OUTPATIENT_CLINIC_OR_DEPARTMENT_OTHER)
Admission: RE | Admit: 2022-11-29 | Discharge: 2022-11-29 | Disposition: A | Discharge status: Home / Self Care | Payer: BC Managed Care – PPO | Source: Ambulatory Visit | Attending: Orthopedic Surgery | Admitting: Orthopedic Surgery

## 2022-11-29 ENCOUNTER — Encounter (HOSPITAL_BASED_OUTPATIENT_CLINIC_OR_DEPARTMENT_OTHER)
Admission: RE | Disposition: A | Discharge status: Home / Self Care | Payer: Self-pay | Source: Ambulatory Visit | Attending: Orthopedic Surgery

## 2022-11-29 DIAGNOSIS — Y93E1 Activity, personal bathing and showering: Secondary | ICD-10-CM | POA: Insufficient documentation

## 2022-11-29 DIAGNOSIS — W182XXA Fall in (into) shower or empty bathtub, initial encounter: Secondary | ICD-10-CM | POA: Diagnosis not present

## 2022-11-29 DIAGNOSIS — S52551A Other extraarticular fracture of lower end of right radius, initial encounter for closed fracture: Secondary | ICD-10-CM | POA: Insufficient documentation

## 2022-11-29 DIAGNOSIS — S52501A Unspecified fracture of the lower end of right radius, initial encounter for closed fracture: Secondary | ICD-10-CM

## 2022-11-29 DIAGNOSIS — F17290 Nicotine dependence, other tobacco product, uncomplicated: Secondary | ICD-10-CM | POA: Insufficient documentation

## 2022-11-29 HISTORY — PX: OPEN REDUCTION INTERNAL FIXATION (ORIF) DISTAL RADIAL FRACTURE: SHX5989

## 2022-11-29 HISTORY — DX: Other specified health status: Z78.9

## 2022-11-29 SURGERY — OPEN REDUCTION INTERNAL FIXATION (ORIF) DISTAL RADIUS FRACTURE
Anesthesia: Monitor Anesthesia Care | Site: Wrist | Laterality: Right

## 2022-11-29 MED ORDER — OXYCODONE-ACETAMINOPHEN 10-325 MG PO TABS: 1.0000 | ORAL_TABLET | Freq: Four times a day (QID) | ORAL | 0 refills | Status: AC | PRN

## 2022-11-29 MED ORDER — CEFAZOLIN SODIUM-DEXTROSE 2-4 GM/100ML-% IV SOLN
INTRAVENOUS | Status: AC
  Filled 2022-11-29: qty 100

## 2022-11-29 MED ORDER — FENTANYL CITRATE (PF) 100 MCG/2ML IJ SOLN
50.0000 ug | Freq: Once | INTRAMUSCULAR | Status: AC
  Administered 2022-11-29: 50 ug via INTRAVENOUS

## 2022-11-29 MED ORDER — DEXAMETHASONE SODIUM PHOSPHATE 10 MG/ML IJ SOLN
INTRAMUSCULAR | Status: DC | PRN
  Administered 2022-11-29: 10 mg

## 2022-11-29 MED ORDER — FENTANYL CITRATE (PF) 100 MCG/2ML IJ SOLN: 25.0000 ug | INTRAMUSCULAR | Status: DC | PRN

## 2022-11-29 MED ORDER — ONDANSETRON HCL 4 MG/2ML IJ SOLN
INTRAMUSCULAR | Status: DC | PRN
  Administered 2022-11-29: 4 mg via INTRAVENOUS

## 2022-11-29 MED ORDER — PROPOFOL 500 MG/50ML IV EMUL
INTRAVENOUS | Status: DC | PRN
  Administered 2022-11-29: 100 ug/kg/min via INTRAVENOUS

## 2022-11-29 MED ORDER — LIDOCAINE 2% (20 MG/ML) 5 ML SYRINGE
INTRAMUSCULAR | Status: DC | PRN
  Administered 2022-11-29: 30 mg via INTRAVENOUS

## 2022-11-29 MED ORDER — MIDAZOLAM HCL 2 MG/2ML IJ SOLN
2.0000 mg | Freq: Once | INTRAMUSCULAR | Status: AC
  Administered 2022-11-29: 2 mg via INTRAVENOUS

## 2022-11-29 MED ORDER — FENTANYL CITRATE (PF) 100 MCG/2ML IJ SOLN
INTRAMUSCULAR | Status: AC
  Filled 2022-11-29: qty 2

## 2022-11-29 MED ORDER — 0.9 % SODIUM CHLORIDE (POUR BTL) OPTIME
TOPICAL | Status: DC | PRN
  Administered 2022-11-29: 100 mL

## 2022-11-29 MED ORDER — LACTATED RINGERS IV SOLN: INTRAVENOUS | Status: DC

## 2022-11-29 MED ORDER — CEFAZOLIN SODIUM-DEXTROSE 2-4 GM/100ML-% IV SOLN
2.0000 g | INTRAVENOUS | Status: AC
  Administered 2022-11-29: 2 g via INTRAVENOUS

## 2022-11-29 MED ORDER — ACETAMINOPHEN 500 MG PO TABS: 1000.0000 mg | ORAL_TABLET | Freq: Once | ORAL | Status: DC

## 2022-11-29 MED ORDER — POVIDONE-IODINE 7.5 % EX SOLN
Freq: Once | CUTANEOUS | Status: DC
  Filled 2022-11-29: qty 118

## 2022-11-29 MED ORDER — PROPOFOL 10 MG/ML IV BOLUS
INTRAVENOUS | Status: DC | PRN
  Administered 2022-11-29: 20 mg via INTRAVENOUS

## 2022-11-29 MED ORDER — MIDAZOLAM HCL 2 MG/2ML IJ SOLN
INTRAMUSCULAR | Status: AC
  Filled 2022-11-29: qty 2

## 2022-11-29 MED ORDER — ROPIVACAINE HCL 5 MG/ML IJ SOLN
INTRAMUSCULAR | Status: DC | PRN
  Administered 2022-11-29: 25 mL via PERINEURAL

## 2022-11-29 SURGICAL SUPPLY — 69 items
BIT DRILL 2.2 SS TIBIAL (BIT) IMPLANT
BLADE SURG 15 STRL LF DISP TIS (BLADE) ×2 IMPLANT
BLADE SURG 15 STRL SS (BLADE) ×1
BNDG CMPR 5X4 KNIT ELC UNQ LF (GAUZE/BANDAGES/DRESSINGS) ×2
BNDG CMPR 9X4 STRL LF SNTH (GAUZE/BANDAGES/DRESSINGS) ×1
BNDG ELASTIC 4INX 5YD STR LF (GAUZE/BANDAGES/DRESSINGS) ×4 IMPLANT
BNDG ESMARK 4X9 LF (GAUZE/BANDAGES/DRESSINGS) ×2 IMPLANT
BNDG GAUZE DERMACEA FLUFF 4 (GAUZE/BANDAGES/DRESSINGS) ×2 IMPLANT
BNDG GZE DERMACEA 4 6PLY (GAUZE/BANDAGES/DRESSINGS) ×1
CANISTER SUCT 1200ML W/VALVE (MISCELLANEOUS) IMPLANT
CORD BIPOLAR FORCEPS 12FT (ELECTRODE) ×2 IMPLANT
COVER BACK TABLE 60X90IN (DRAPES) ×2 IMPLANT
CUFF TOURN SGL QUICK 18X4 (TOURNIQUET CUFF) ×2 IMPLANT
DRAPE EXTREMITY T 121X128X90 (DISPOSABLE) ×2 IMPLANT
DRAPE OEC MINIVIEW 54X84 (DRAPES) ×2 IMPLANT
DRAPE SURG 17X23 STRL (DRAPES) ×2 IMPLANT
DRSG EMULSION OIL 3X3 NADH (GAUZE/BANDAGES/DRESSINGS) ×2 IMPLANT
GAUZE SPONGE 4X4 12PLY STRL (GAUZE/BANDAGES/DRESSINGS) ×2 IMPLANT
GLOVE BIO SURGEON STRL SZ 6.5 (GLOVE) ×2 IMPLANT
GLOVE BIOGEL PI IND STRL 6.5 (GLOVE) ×2 IMPLANT
GLOVE BIOGEL PI IND STRL 8.5 (GLOVE) ×2 IMPLANT
GLOVE SURG ORTHO 8.0 STRL STRW (GLOVE) ×2 IMPLANT
GOWN STRL REUS W/ TWL LRG LVL3 (GOWN DISPOSABLE) ×4 IMPLANT
GOWN STRL REUS W/ TWL XL LVL3 (GOWN DISPOSABLE) ×2 IMPLANT
GOWN STRL REUS W/TWL LRG LVL3 (GOWN DISPOSABLE) ×2
GOWN STRL REUS W/TWL XL LVL3 (GOWN DISPOSABLE) ×2
K-WIRE 1.6 (WIRE) ×1
K-WIRE FX5X1.6XNS BN SS (WIRE) ×1
KWIRE FX5X1.6XNS BN SS (WIRE) IMPLANT
NDL HYPO 25X1 1.5 SAFETY (NEEDLE) IMPLANT
NEEDLE HYPO 25X1 1.5 SAFETY (NEEDLE) IMPLANT
NS IRRIG 1000ML POUR BTL (IV SOLUTION) ×2 IMPLANT
PACK BASIN DAY SURGERY FS (CUSTOM PROCEDURE TRAY) ×2 IMPLANT
PAD CAST 4YDX4 CTTN HI CHSV (CAST SUPPLIES) ×4 IMPLANT
PADDING CAST ABS COTTON 4X4 ST (CAST SUPPLIES) ×2 IMPLANT
PADDING CAST COTTON 4X4 STRL (CAST SUPPLIES) ×2
PEG LOCKING SMOOTH 2.2X20 (Screw) IMPLANT
PEG LOCKING SMOOTH 2.2X22 (Screw) IMPLANT
PEG LOCKING SMOOTH 2.2X24 (Peg) IMPLANT
PLATE DVR CROSSLOCK STD RT (Plate) IMPLANT
SCREW LOCK 16X2.7X 3 LD TPR (Screw) IMPLANT
SCREW LOCK 18X2.7X 3 LD TPR (Screw) IMPLANT
SCREW LOCKING 2.7X16 (Screw) ×2 IMPLANT
SCREW LOCKING 2.7X18 (Screw) ×3 IMPLANT
SCREW MULTI DIRECTIONAL 2.7X20 (Screw) IMPLANT
SCREW MULTI DIRECTIONAL 2.7X22 (Screw) IMPLANT
SLEEVE SCD COMPRESS KNEE MED (STOCKING) ×2 IMPLANT
SLING ARM FOAM STRAP LRG (SOFTGOODS) IMPLANT
SPIKE FLUID TRANSFER (MISCELLANEOUS) IMPLANT
SPLINT FIBERGLASS 3X35 (CAST SUPPLIES) IMPLANT
SPLINT FIBERGLASS 4X30 (CAST SUPPLIES) IMPLANT
STOCKINETTE 4X48 STRL (DRAPES) ×2 IMPLANT
SUCTION TUBE FRAZIER 10FR DISP (SUCTIONS) IMPLANT
SUT MNCRL AB 3-0 PS2 18 (SUTURE) IMPLANT
SUT MON AB 3-0 SH 27 (SUTURE)
SUT MON AB 3-0 SH27 (SUTURE) IMPLANT
SUT PROLENE 3 0 PS 1 (SUTURE) IMPLANT
SUT PROLENE 4 0 PS 2 18 (SUTURE) ×2 IMPLANT
SUT VIC AB 0 CT1 27 (SUTURE)
SUT VIC AB 0 CT1 27XBRD ANBCTR (SUTURE) IMPLANT
SUT VIC AB 2-0 PS2 27 (SUTURE) ×2 IMPLANT
SUT VIC AB 2-0 SH 27 (SUTURE)
SUT VIC AB 2-0 SH 27XBRD (SUTURE) IMPLANT
SUT VIC AB 4-0 PS2 18 (SUTURE) ×2 IMPLANT
SYR BULB EAR ULCER 3OZ GRN STR (SYRINGE) ×2 IMPLANT
SYR CONTROL 10ML LL (SYRINGE) IMPLANT
TOWEL GREEN STERILE FF (TOWEL DISPOSABLE) ×2 IMPLANT
TUBE CONNECTING 20X1/4 (TUBING) IMPLANT
UNDERPAD 30X36 HEAVY ABSORB (UNDERPADS AND DIAPERS) ×2 IMPLANT

## 2022-11-29 NOTE — Discharge Instructions (Addendum)
KEEP BANDAGE CLEAN AND DRY CALL OFFICE FOR F/U APPT [id number] in 15 days KEEP HAND ELEVATED ABOVE HEART OK TO APPLY ICE TO OPERATIVE AREA CONTACT OFFICE IF ANY WORSENING PAIN OR CONCERNS.    Post Anesthesia Home Care Instructions  Activity: Get plenty of rest for the remainder of the day. A responsible individual must stay with you for 24 hours following the procedure.  For the next 24 hours, DO NOT: -Drive a car -Operate machinery -Drink alcoholic beverages -Take any medication unless instructed by your physician -Make any legal decisions or sign important papers.  Meals: Start with liquid foods such as gelatin or soup. Progress to regular foods as tolerated. Avoid greasy, spicy, heavy foods. If nausea and/or vomiting occur, drink only clear liquids until the nausea and/or vomiting subsides. Call your physician if vomiting continues.  Special Instructions/Symptoms: Your throat may feel dry or sore from the anesthesia or the breathing tube placed in your throat during surgery. If this causes discomfort, gargle with warm salt water. The discomfort should disappear within 24 hours.  If you had a scopolamine patch placed behind your ear for the management of post- operative nausea and/or vomiting:  1. The medication in the patch is effective for 72 hours, after which it should be removed.  Wrap patch in a tissue and discard in the trash. Wash hands thoroughly with soap and water. 2. You may remove the patch earlier than 72 hours if you experience unpleasant side effects which may include dry mouth, dizziness or visual disturbances. 3. Avoid touching the patch. Wash your hands with soap and water after contact with the patch.  Regional Anesthesia Blocks  1. Numbness or the inability to move the "blocked" extremity may last from 3-48 hours after placement. The length of time depends on the medication injected and your individual response to the medication. If the numbness is not going away  after 48 hours, call your surgeon.  2. The extremity that is blocked will need to be protected until the numbness is gone and the  Strength has returned. Because you cannot feel it, you will need to take extra care to avoid injury. Because it may be weak, you may have difficulty moving it or using it. You may not know what position it is in without looking at it while the block is in effect.  3. For blocks in the legs and feet, returning to weight bearing and walking needs to be done carefully. You will need to wait until the numbness is entirely gone and the strength has returned. You should be able to move your leg and foot normally before you try and bear weight or walk. You will need someone to be with you when you first try to ensure you do not fall and possibly risk injury.  4. Bruising and tenderness at the needle site are common side effects and will resolve in a few days.  5. Persistent numbness or new problems with movement should be communicated to the surgeon or the [facility] ([phone])/ [facility] ([phone]).    

## 2022-11-29 NOTE — Anesthesia Procedure Notes (Signed)
Anesthesia Regional Block: Supraclavicular block   Pre-Anesthetic Checklist: , timeout performed,  Correct Patient, Correct Site, Correct Laterality,  Correct Procedure, Correct Position, site marked,  Risks and benefits discussed,  Pre-op evaluation,  At surgeon's request and post-op pain management  Laterality: Right  Prep: Maximum Sterile Barrier Precautions used, chloraprep       Needles:  Injection technique: Single-shot  Needle Type: Echogenic Stimulator Needle     Needle Length: 5cm  Needle Gauge: 21     Additional Needles:   Procedures:,,,, ultrasound used (permanent image in chart),,    Narrative:  Start time: 11/29/2022 8:46 AM End time: 11/29/2022 8:50 AM Injection made incrementally with aspirations every 5 mL. Anesthesiologist: Elmer Picker, MD

## 2022-11-29 NOTE — Op Note (Signed)
PREOPERATIVE DIAGNOSIS: Right wrist extra-articular distal radius fracture  POSTOPERATIVE DIAGNOSIS: Same   ATTENDING SURGEON: Dr. Bradly Bienenstock who scrubbed and present for the entire procedure  ASSISTANT SURGEON: None  ANESTHESIA: Regional with IV sedation  OPERATIVE PROCEDURE: Open treatment of right wrist extra-articular distal radius fracture requiring internal fixation Right wrist brachial radialis tendon release, tendon tenotomy. Radiographs 3 views right wrist  IMPLANTS: [employer]omet DVR cross lock  EBL: Minimal  RADIOGRAPHIC INTERPRETATION: AP lateral oblique views of the wrist do show the volar plate fixation in place with good alignment of the wrist in all planes.  SURGICAL INDICATIONS: Patient is a right-hand-dominant gentleman sustained a closed injury to the right distal radius.  Patient is seen and evaluated the office and recommend undergo the above procedure.  Risks of surgery include but not limited to bleeding infection damage nearby nerves arteries or tendons nonunion malunion hardware failure loss of motion of the wrist needs incomplete symptoms and need for further surgical invention.  SURGICAL TECHNIQUE: Patient brought in from preoperative holding area marked for marker made in the right wrist indicate correct operative site.  Patient brought back to operating placed supine on the anesthesia table where the regional anesthetic was administered.  Patient tolerated this well.  Well-padded tourniquet placed on the right brachium and stay with appropriate drape.  Right upper extremities then prepped and draped normal sterile fashion.  Timeout was called correct site was then procedure began.  Attention was then turned to the right wrist for the last 2 incision made directly over the FCR sheath.  Dissection carried down through the skin and subcutaneous tissue.  Deep dissection carried down through the floor the FCR sheath which was retracted ulnarly.  Following this blunt dissection  was then carried out protecting the FPL and the pronator quadratus was elevated in an L-shaped fashion.  In order to obtain reduction of the radial tunnel the brachial radialis tendon was then carefully identified and released tendon.  This was separate intervention.  Following this open reduction was then performed.  Following this the volar plate was then applied and held distally with a K wire.  Plate position was then confirmed.  Following this distal fixation was then carried out from an ulnar to radial direction with distal locking pegs and multidirectional screws.  Final shaft fixation was then carried out with combination of locking nonlocking screws.  The wound was then thoroughly irrigated.  Final radiographs were then obtained.  Pronator was then closed with 2-0 Vicryl suture.  Subcutaneous tissues closed with Monocryl and skin closed with simple Prolene sutures.  Adaptic dressing sterile compressive bandaging applied.  The patient is a placed in a short arm volar splint taken recovery in good condition.  POSTOPERATIVE PLAN: Patient be discharged home.  See him back in the office in approxi-2 weeks for wound check suture removal x-rays application of short arm cast total immobilization for 4 weeks then begin outpatient therapy.  Radiographs at each visit placed the therapy order the first postoperative visit.

## 2022-11-29 NOTE — Progress Notes (Signed)
Assisted Dr. Woodrum with right, supraclavicular, ultrasound guided block. Side rails up, monitors on throughout procedure. See vital signs in flow sheet. Tolerated Procedure well. 

## 2022-11-29 NOTE — Transfer of Care (Signed)
Immediate Anesthesia Transfer of Care Note  Patient: Dominic Lowery  Procedure(s) Performed: OPEN REDUCTION INTERNAL FIXATION (ORIF) DISTAL RADIUS FRACTURE (Right: Wrist)  Patient Location: PACU  Anesthesia Type:MAC combined with regional for post-op pain  Level of Consciousness: drowsy and patient cooperative  Airway & Oxygen Therapy: Patient Spontanous Breathing and Patient connected to face mask oxygen  Post-op Assessment: Report given to RN and Post -op Vital signs reviewed and stable  Post vital signs: Reviewed and stable  Last Vitals:  Vitals Value Taken Time  BP    Temp    Pulse 80 11/29/22 1029  Resp 18 11/29/22 1029  SpO2 100 % 11/29/22 1029  Vitals shown include unvalidated device data.  Last Pain:  Vitals:   11/29/22 0724  TempSrc: Oral  PainSc: 5       Patients Stated Pain Goal: 5 (11/29/22 0724)  Complications: No notable events documented.

## 2022-11-29 NOTE — Anesthesia Preprocedure Evaluation (Addendum)
Anesthesia Evaluation  Patient identified by MRN, date of birth, ID band Patient awake    Reviewed: Allergy & Precautions, NPO status , Patient's Chart, lab work & pertinent test results  Airway Mallampati: I  TM Distance: >3 FB Neck ROM: Full    Dental  (+) Dental Advisory Given, Chipped,    Pulmonary Current Smoker   Pulmonary exam normal breath sounds clear to auscultation       Cardiovascular negative cardio ROS Normal cardiovascular exam Rhythm:Regular Rate:Normal     Neuro/Psych negative neurological ROS  negative psych ROS   GI/Hepatic negative GI ROS, Neg liver ROS,,,  Endo/Other  negative endocrine ROS    Renal/GU negative Renal ROS  negative genitourinary   Musculoskeletal negative musculoskeletal ROS (+)    Abdominal   Peds  Hematology negative hematology ROS (+)   Anesthesia Other Findings   Reproductive/Obstetrics                             Anesthesia Physical Anesthesia Plan  ASA: 2  Anesthesia Plan: MAC and Regional   Post-op Pain Management: Regional block* and Tylenol PO (pre-op)*   Induction: Intravenous  PONV Risk Score and Plan: Propofol infusion, Treatment may vary due to age or medical condition, Midazolam and Ondansetron  Airway Management Planned: Natural Airway  Additional Equipment:   Intra-op Plan:   Post-operative Plan:   Informed Consent: I have reviewed the patients History and Physical, chart, labs and discussed the procedure including the risks, benefits and alternatives for the proposed anesthesia with the patient or authorized representative who has indicated his/her understanding and acceptance.     Dental advisory given  Plan Discussed with: CRNA  Anesthesia Plan Comments:        Anesthesia Quick Evaluation

## 2022-11-29 NOTE — Anesthesia Procedure Notes (Signed)
Procedure Name: MAC Date/Time: 11/29/2022 9:36 AM  Performed by: Sheryn Bison, CRNAPre-anesthesia Checklist: Patient identified, Emergency Drugs available, Suction available, Patient being monitored and Timeout performed Patient Re-evaluated:Patient Re-evaluated prior to induction Oxygen Delivery Method: Simple face mask

## 2022-11-29 NOTE — Anesthesia Postprocedure Evaluation (Signed)
Anesthesia Post Note  Patient: Dominic Lowery  Procedure(s) Performed: OPEN REDUCTION INTERNAL FIXATION (ORIF) DISTAL RADIUS FRACTURE (Right: Wrist)     Patient location during evaluation: PACU Anesthesia Type: Regional and MAC Level of consciousness: awake and alert Pain management: pain level controlled Vital Signs Assessment: post-procedure vital signs reviewed and stable Respiratory status: spontaneous breathing, nonlabored ventilation, respiratory function stable and patient connected to nasal cannula oxygen Cardiovascular status: stable and blood pressure returned to baseline Postop Assessment: no apparent nausea or vomiting Anesthetic complications: no  No notable events documented.  Last Vitals:  Vitals:   11/29/22 1045 11/29/22 1109  BP: 126/85 (!) 169/97  Pulse: 73 75  Resp: 17 18  Temp:  (!) 36.3 C  SpO2: 100% 98%    Last Pain:  Vitals:   11/29/22 1109  TempSrc:   PainSc: 0-No pain                 Samanyu Tinnell L Issa Kosmicki

## 2022-11-30 ENCOUNTER — Encounter (HOSPITAL_BASED_OUTPATIENT_CLINIC_OR_DEPARTMENT_OTHER): Payer: Self-pay | Admitting: Orthopedic Surgery

## 2023-05-11 ENCOUNTER — Encounter (HOSPITAL_COMMUNITY): Payer: Self-pay

## 2023-05-11 ENCOUNTER — Emergency Department (HOSPITAL_COMMUNITY)
Admission: EM | Admit: 2023-05-11 | Discharge: 2023-05-11 | Disposition: A | Discharge status: Home / Self Care | Payer: BC Managed Care – PPO | Attending: Emergency Medicine | Admitting: Emergency Medicine

## 2023-05-11 ENCOUNTER — Other Ambulatory Visit: Payer: Self-pay

## 2023-05-11 DIAGNOSIS — R059 Cough, unspecified: Secondary | ICD-10-CM

## 2023-05-11 DIAGNOSIS — J029 Acute pharyngitis, unspecified: Secondary | ICD-10-CM | POA: Insufficient documentation

## 2023-05-11 DIAGNOSIS — Z20822 Contact with and (suspected) exposure to covid-19: Secondary | ICD-10-CM | POA: Diagnosis not present

## 2023-05-11 LAB — GROUP A STREP BY PCR: Group A Strep by PCR: NOT DETECTED

## 2023-05-11 LAB — SARS CORONAVIRUS 2 BY RT PCR: SARS Coronavirus 2 by RT PCR: NEGATIVE

## 2023-05-11 MED ORDER — GUAIFENESIN 100 MG/5ML PO LIQD: 100.0000 mg | ORAL | 0 refills | Status: AC | PRN

## 2023-05-11 MED ORDER — IBUPROFEN 800 MG PO TABS
800.0000 mg | ORAL_TABLET | Freq: Once | ORAL | Status: AC
  Administered 2023-05-11: 800 mg via ORAL
  Filled 2023-05-11: qty 1

## 2023-05-11 NOTE — ED Provider Notes (Addendum)
[place] EMERGENCY DEPARTMENT AT [facility] Provider Note   CSN: [id number] Arrival date & time: 05/11/23  1191     History  Chief Complaint  Patient presents with   Cough   HPI Dominic Lowery is a 42 y.o. male presenting for cough and sore throat.  Started about a week ago.  Endorses subjective fever at home with chills.  States his mother is also had a sore throat as well.  Has been taking NyQuil intermittently to help with symptoms.  Denies shortness of breath or chest pain.   Cough      Home Medications Prior to Admission medications   Medication Sig Start Date End Date Taking? Authorizing Provider  guaiFENesin (ROBITUSSIN) 100 MG/5ML liquid Take 5-10 mLs (100-200 mg total) by mouth every 4 (four) hours as needed for cough or to loosen phlegm. 05/11/23  Yes Gareth Eagle, PA-C  ibuprofen (ADVIL) 800 MG tablet Take 1 tablet (800 mg total) by mouth 3 (three) times daily. 08/09/22   Ernie Avena, MD  oxyCODONE-acetaminophen (PERCOCET) 10-325 MG tablet Take 1 tablet by mouth every 6 (six) hours as needed for pain. 11/29/22 11/29/23  Bradly Bienenstock, MD      Allergies    Patient has no known allergies.    Review of Systems   Review of Systems  Respiratory:  Positive for cough.     Physical Exam Updated Vital Signs BP (!) 153/107   Pulse 66   Temp 98.6 F (37 C) (Oral)   Resp 16   Ht 6' (1.829 m)   Wt 77.1 kg   SpO2 99%   BMI 23.06 kg/m  Physical Exam Constitutional:      Appearance: Normal appearance.  HENT:     Head: Normocephalic.     Nose: Nose normal.     Mouth/Throat:     Tongue: No lesions. Tongue does not deviate from midline.     Palate: No mass and lesions.     Pharynx: Oropharynx is clear. Uvula midline.     Tonsils: No tonsillar exudate or tonsillar abscesses.  Eyes:     Conjunctiva/sclera: Conjunctivae normal.  Cardiovascular:     Rate and Rhythm: Normal rate and regular rhythm.     Pulses: Normal pulses.     Heart sounds:  Normal heart sounds.  Pulmonary:     Effort: Pulmonary effort is normal.     Breath sounds: No wheezing, rhonchi or rales.  Neurological:     Mental Status: He is alert.  Psychiatric:        Mood and Affect: Mood normal.     ED Results / Procedures / Treatments   Labs (all labs ordered are listed, but only abnormal results are displayed) Labs Reviewed  GROUP A STREP BY PCR  SARS CORONAVIRUS 2 BY RT PCR    EKG None  Radiology No results found.  Procedures Procedures    Medications Ordered in ED Medications  ibuprofen (ADVIL) tablet 800 mg (has no administration in time range)    ED Course/ Medical Decision Making/ A&P                                 Medical Decision Making Risk OTC drugs. Prescription drug management.   42 year old well-appearing male presenting for sore throat and cough.  Exam was unremarkable.  Strep and COVID PCR's were negative.  Posterior oropharynx also appears normal on exam.  Considered pneumonia  but unlikely given no crackles on exam, afebrile well-appearing.  Suspect his symptoms could be related to a viral URI or cold exposure.  Advised conservative treatment at home.  Advised to follow-up PCP if symptoms persist.  Vital stable.  Discharged.  Sent Robitussin to his pharmacy to take at night for his cough as needed.    Final Clinical Impression(s) / ED Diagnoses Final diagnoses:  Cough, unspecified type    Rx / DC Orders ED Discharge Orders          Ordered    guaiFENesin (ROBITUSSIN) 100 MG/5ML liquid  Every 4 hours PRN        05/11/23 0911              Gareth Eagle, PA-C 05/11/23 1324    Cathren Laine, MD 05/11/23 [phone]

## 2023-05-11 NOTE — ED Notes (Signed)
Patient verbalizes understanding of discharge instructions. Opportunity for questioning and answers were provided. Pt discharged from ED. 

## 2023-05-11 NOTE — ED Triage Notes (Signed)
Repots having fever, chills, cough and sore throat. States his mom was sick with sore throat recently. Took Nyquil to help with symptoms.

## 2023-05-11 NOTE — Discharge Instructions (Addendum)
Evaluation today was overall reassuring.  Suspect your cough and sore throat could be related to cold exposure versus viral upper respiratory infection.  Recommend rest and hydration.  He can also take ibuprofen and Tylenol as needed for symptomatic relief.  If your symptoms persist recommend you follow-up with your PCP.

## 2023-12-26 ENCOUNTER — Emergency Department (HOSPITAL_COMMUNITY)
Admission: EM | Admit: 2023-12-26 | Discharge: 2023-12-27 | Disposition: A | Discharge status: Home / Self Care | Attending: Emergency Medicine | Admitting: Emergency Medicine

## 2023-12-26 ENCOUNTER — Other Ambulatory Visit: Payer: Self-pay

## 2023-12-26 ENCOUNTER — Encounter (HOSPITAL_COMMUNITY): Payer: Self-pay | Admitting: Emergency Medicine

## 2023-12-26 DIAGNOSIS — L237 Allergic contact dermatitis due to plants, except food: Secondary | ICD-10-CM | POA: Diagnosis not present

## 2023-12-26 DIAGNOSIS — R21 Rash and other nonspecific skin eruption: Secondary | ICD-10-CM | POA: Diagnosis present

## 2023-12-26 NOTE — ED Triage Notes (Signed)
 Patient c/o rash x 4 days. Patient report he thinks its poison ivy when his trying to clean his trunk. Patient report using benadryl  cream without relief.

## 2023-12-27 MED ORDER — HYDROCORTISONE 1 % EX CREA: TOPICAL_CREAM | CUTANEOUS | 0 refills | Status: AC

## 2023-12-27 NOTE — ED Notes (Signed)
 Patient states rash is located on back of neck and bilateral forearms.  Patient states he picked some weeds up that were in a box truck at work.  Patient has used benadryl  lotion and some kind of poison ivy lotion without any results.

## 2023-12-27 NOTE — ED Provider Notes (Signed)
 [place] EMERGENCY DEPARTMENT AT [facility] Provider Note   CSN: [id number] Arrival date & time: 12/26/23  2334     Patient presents with: Rash   Dominic Lowery is a 43 y.o. male.    Rash Patient presents for pruritic rash.  He did have a known exposure to poison ivy.  He has since developed urticaria rash in area of left wrist, left hand, and back of neck.  He denies any associated pain.  He denies any systemic symptoms.  He has been using Benadryl  cream without relief.     Prior to Admission medications   Medication Sig Start Date End Date Taking? Authorizing Provider  hydrocortisone  cream 1 % Apply to affected area 2 times daily 12/27/23  Yes Melvenia Motto, MD  guaiFENesin  (ROBITUSSIN) 100 MG/5ML liquid Take 5-10 mLs (100-200 mg total) by mouth every 4 (four) hours as needed for cough or to loosen phlegm. 05/11/23   Robinson, John K, PA-C  ibuprofen  (ADVIL ) 800 MG tablet Take 1 tablet (800 mg total) by mouth 3 (three) times daily. 08/09/22   Jerrol Agent, MD    Allergies: Patient has no known allergies.    Review of Systems  Skin:  Positive for rash.  All other systems reviewed and are negative.   Updated Vital Signs BP (!) 140/78 (BP Location: Right Arm)   Pulse 80   Temp 98 F (36.7 C) (Oral)   Resp 18   SpO2 100%   Physical Exam Vitals and nursing note reviewed.  Constitutional:      General: He is not in acute distress.    Appearance: Normal appearance. He is well-developed. He is not ill-appearing, toxic-appearing or diaphoretic.  HENT:     Head: Normocephalic and atraumatic.     Right Ear: External ear normal.     Left Ear: External ear normal.     Nose: Nose normal.     Mouth/Throat:     Mouth: Mucous membranes are moist.  Eyes:     Extraocular Movements: Extraocular movements intact.     Conjunctiva/sclera: Conjunctivae normal.  Cardiovascular:     Rate and Rhythm: Normal rate and regular rhythm.  Pulmonary:     Effort: Pulmonary  effort is normal. No respiratory distress.  Abdominal:     General: There is no distension.     Palpations: Abdomen is soft.  Musculoskeletal:        General: No swelling. Normal range of motion.     Cervical back: Normal range of motion and neck supple.  Skin:    General: Skin is warm and dry.     Capillary Refill: Capillary refill takes less than 2 seconds.     Findings: Rash present.  Neurological:     General: No focal deficit present.     Mental Status: He is alert and oriented to person, place, and time.  Psychiatric:        Mood and Affect: Mood normal.        Behavior: Behavior normal.     (all labs ordered are listed, but only abnormal results are displayed) Labs Reviewed - No data to display  EKG: None  Radiology: No results found.   Procedures   Medications Ordered in the ED - No data to display                                  Medical Decision Making  Patient  presents for small areas of pruritic rash over the past 4 days.  He did have a known exposure to poison ivy.  Vital signs on arrival in the ED are normal.  Patient is well-appearing on exam.  He has small nontender areas of rash on left forearm, left hand, and back of neck.  Appearance of rash is consistent with poison ivy.  Patient was prescribed hydrocortisone  cream and advised to take Benadryl  as needed for itchiness.  He was discharged in stable condition.     Final diagnoses:  Allergic contact dermatitis due to plants, except food    ED Discharge Orders          Ordered    hydrocortisone  cream 1 %        12/27/23 0120               Melvenia Motto, MD 12/27/23 0121

## 2023-12-27 NOTE — Discharge Instructions (Signed)
 A prescription for a steroid cream was sent to your pharmacy.  Apply this to areas of rash 2 times per day.  Take oral Benadryl  as needed for itchiness.  Return to the emergency department for any new or worsening symptoms of concern.
